# Patient Record
Sex: Female | Born: 1960 | Race: Black or African American | Hispanic: No | Marital: Married | Smoking: Never smoker
Health system: Southern US, Community
[De-identification: ages and names within clinical notes are randomized; demographics above are authoritative.]

## PROBLEM LIST (undated history)

## (undated) DIAGNOSIS — G5762 Lesion of plantar nerve, left lower limb: Secondary | ICD-10-CM

## (undated) DIAGNOSIS — I440 Atrioventricular block, first degree: Secondary | ICD-10-CM

## (undated) DIAGNOSIS — R55 Syncope and collapse: Secondary | ICD-10-CM

## (undated) DIAGNOSIS — I4510 Unspecified right bundle-branch block: Secondary | ICD-10-CM

## (undated) DIAGNOSIS — H9190 Unspecified hearing loss, unspecified ear: Secondary | ICD-10-CM

## (undated) DIAGNOSIS — I10 Essential (primary) hypertension: Secondary | ICD-10-CM

## (undated) HISTORY — PX: NEURECTOMY FOOT: SUR888

## (undated) HISTORY — DX: Essential (primary) hypertension: I10

---

## 1997-07-16 ENCOUNTER — Other Ambulatory Visit: Admission: RE | Admit: 1997-07-16 | Discharge: 1997-07-16 | Payer: Self-pay | Admitting: Obstetrics and Gynecology

## 1997-08-12 ENCOUNTER — Ambulatory Visit (HOSPITAL_COMMUNITY): Admission: RE | Admit: 1997-08-12 | Discharge: 1997-08-12 | Payer: Self-pay | Admitting: Obstetrics and Gynecology

## 1998-07-21 ENCOUNTER — Other Ambulatory Visit: Admission: RE | Admit: 1998-07-21 | Discharge: 1998-07-21 | Payer: Self-pay | Admitting: Obstetrics and Gynecology

## 1998-10-07 ENCOUNTER — Encounter (INDEPENDENT_AMBULATORY_CARE_PROVIDER_SITE_OTHER): Payer: Self-pay

## 1998-10-07 ENCOUNTER — Ambulatory Visit (HOSPITAL_COMMUNITY): Admission: RE | Admit: 1998-10-07 | Discharge: 1998-10-07 | Payer: Self-pay | Admitting: Obstetrics and Gynecology

## 1999-03-31 ENCOUNTER — Other Ambulatory Visit: Admission: RE | Admit: 1999-03-31 | Discharge: 1999-03-31 | Payer: Self-pay | Admitting: Obstetrics and Gynecology

## 1999-06-29 ENCOUNTER — Ambulatory Visit (HOSPITAL_COMMUNITY): Admission: RE | Admit: 1999-06-29 | Discharge: 1999-06-29 | Payer: Self-pay | Admitting: Gastroenterology

## 1999-06-29 ENCOUNTER — Encounter (INDEPENDENT_AMBULATORY_CARE_PROVIDER_SITE_OTHER): Payer: Self-pay | Admitting: *Deleted

## 1999-09-27 ENCOUNTER — Encounter: Admission: RE | Admit: 1999-09-27 | Discharge: 1999-09-27 | Payer: Self-pay | Admitting: Gastroenterology

## 1999-09-27 ENCOUNTER — Encounter: Payer: Self-pay | Admitting: Gastroenterology

## 1999-10-13 ENCOUNTER — Encounter: Payer: Self-pay | Admitting: Obstetrics and Gynecology

## 1999-10-14 DIAGNOSIS — K648 Other hemorrhoids: Secondary | ICD-10-CM | POA: Insufficient documentation

## 1999-10-19 ENCOUNTER — Inpatient Hospital Stay (HOSPITAL_COMMUNITY): Admission: RE | Admit: 1999-10-19 | Discharge: 1999-10-21 | Payer: Self-pay | Admitting: Obstetrics and Gynecology

## 1999-10-19 ENCOUNTER — Encounter (INDEPENDENT_AMBULATORY_CARE_PROVIDER_SITE_OTHER): Payer: Self-pay

## 2001-01-23 ENCOUNTER — Encounter: Payer: Self-pay | Admitting: Obstetrics and Gynecology

## 2001-01-23 ENCOUNTER — Ambulatory Visit (HOSPITAL_COMMUNITY): Admission: RE | Admit: 2001-01-23 | Discharge: 2001-01-23 | Payer: Self-pay | Admitting: Obstetrics and Gynecology

## 2002-01-28 ENCOUNTER — Encounter: Payer: Self-pay | Admitting: Family Medicine

## 2002-01-28 ENCOUNTER — Ambulatory Visit (HOSPITAL_COMMUNITY): Admission: RE | Admit: 2002-01-28 | Discharge: 2002-01-28 | Payer: Self-pay | Admitting: Family Medicine

## 2002-05-13 ENCOUNTER — Other Ambulatory Visit: Admission: RE | Admit: 2002-05-13 | Discharge: 2002-05-13 | Payer: Self-pay | Admitting: Obstetrics and Gynecology

## 2003-08-05 ENCOUNTER — Ambulatory Visit (HOSPITAL_COMMUNITY): Admission: RE | Admit: 2003-08-05 | Discharge: 2003-08-05 | Payer: Self-pay | Admitting: Family Medicine

## 2009-12-01 ENCOUNTER — Encounter: Admission: RE | Admit: 2009-12-01 | Discharge: 2009-12-01 | Payer: Self-pay | Admitting: Family Medicine

## 2010-03-26 ENCOUNTER — Other Ambulatory Visit: Payer: Self-pay | Admitting: Family Medicine

## 2010-03-26 ENCOUNTER — Ambulatory Visit
Admission: RE | Admit: 2010-03-26 | Discharge: 2010-03-26 | Disposition: A | Discharge status: Home / Self Care | Payer: Self-pay | Source: Ambulatory Visit | Attending: Family Medicine | Admitting: Family Medicine

## 2010-03-26 DIAGNOSIS — Z0000 Encounter for general adult medical examination without abnormal findings: Secondary | ICD-10-CM

## 2010-03-31 ENCOUNTER — Ambulatory Visit
Admission: RE | Admit: 2010-03-31 | Discharge: 2010-03-31 | Disposition: A | Discharge status: Home / Self Care | Payer: Self-pay | Source: Ambulatory Visit | Attending: Family Medicine | Admitting: Family Medicine

## 2010-03-31 ENCOUNTER — Other Ambulatory Visit: Payer: Self-pay | Admitting: Family Medicine

## 2010-03-31 DIAGNOSIS — J984 Other disorders of lung: Secondary | ICD-10-CM

## 2010-07-09 NOTE — Op Note (Signed)
[facility]  Patient:    Christine Salazar, Christine Salazar                 MRN: [id number] Proc. Date: 10/19/99 Adm. Date:  [id number] Attending:  Madelyn Flavors CC:         [facility]             Kern Reap, M.D.             Anselmo Rod, M.D.                           Operative Report  PREOPERATIVE DIAGNOSES:       1. Menorrhagia unresponsive to medical                                  management.                               2. Left hydrosalpinx.  POSTOPERATIVE DIAGNOSES:      1. Menorrhagia unresponsive to medical                                  management.                               2. Left ovarian cyst.                               3. Extensive adhesive disease.                               4. Endometriosis.  OPERATION:                    Total abdominal hysterectomy, left                               salpingo-oophorectomy, extensive lysis of                               adhesions.  SURGEON:                      Beather Arbour. Thomasena Edis, M.D.  ASSISTANT:                    Trevor Iha, M.D.  DRAINS:                       Foley catheter.  COMPLICATIONS:                None.  FLUIDS:                       Approximately 2000 cc of Crystalloid and                               cefazolin 1 g IV.  URINE OUTPUT:  300 cc.  FINDINGS:                     Left tube and ovary extensively adhesed to the                               left pelvic sidewall and deep into the pelvis,                               extensive pelvic adhesions.  ANESTHESIA:  DESCRIPTION OF PROCEDURE:     The patient was brought to the operating room and identified on the operating room table.  After induction of adequate general endotracheal anesthesia, the patient was placed in the supine position and prepped and draped in the usual sterile fashion.  A Foley catheter was placed.  A Pfannenstiel incision was made and carried down to  the fascia.  The fascia was scored in the midline and extended bilaterally using Mayo scissors. It was then separated free from the underlying muscles. The muscles were separated in midline down to the symphysis. Due to scarring, there was noted to be oozing but excellent hemostasis was achieved at each layer.  The peritoneum was entered sharply and carefully, taking care to avoid bowel or other abdominal contents. The peritoneum incision was extended superiorly then inferiorly down to the bladder edge.  There was noted to be extensive omental adhesions to the left anterior abdominal wall but these did not need to be lysed in order to facilitate closure. The uterus was noted to be extensively adhesed, deepened into the pelvis. There was noted to be extensive adhesion of the left tube and ovary.  After placement of the OConnor-OSullivan retractor and packing the bowel out of the operative field, Kelly clamps were placed either lateral border of the uterus and the uterus was elevated up to the level of the skin incision.  Prior to this time, the uterus was mobilized with extensive lysis of adhesions. The right round ligament was ligated with a suture of 0 Vicryl, divided, and the anterior leaf of the broad ligament was separated to the area overlying the internal os. The vessels were skeletonized.  The uterine vessels on the right were clamped with a curved Heaney, cut and suture ligated using a suture of 0 Vicryl.  In a similar fashion, the left round ligament was ligated with a suture of 0 Vicryl, divided, and the anterior leaf of the broad ligament was divided to the area overlying the internal os.  The utero-ovarian ligament was clamped after clear space was developed, cut and tied with a free tie.  The decision was made, due to extensive adhesions, to go back after the hysterectomy and remove the left tube and ovary.  The vessels on the left were then skeletonized and a curved Heaney was  used to clamp the uterine vessels which were then cut, suture ligated using a suture of 0 Vicryl.  Successive cardinal uterosacral ligament complex bites were taken with straight Heaneys, cut and suture ligated using sutures of 0 Vicryl.  This was done after the bladder was taken down over the cervix.  The uterosacral ligament complex on the right was clamped with a curved Heaney, cut and suture ligated using figure-of-eight sutures of 0 Vicryl.  This was accomplished on the left in a similar fashion.  After noting that the bladder was well  down and the uterus was free of adhesions, a curved Heaney was placed across the distal portion of the cervix and the vagina was entered.  This angle was then sutured using a figure-of-eight suture of 0 Vicryl.  In a similar fashion, the vagina was entered on the left. Figure-of-eight sutures of 0 Vicryl were placed then to close the vaginal cuff.  There was noted to be no bleeding at the vaginal cuff.  Attention was then turned to the left tube and ovary. These were very densely adhesed such that landmarks could not easily be identified.  However, the cyst had ruptured and we were able to palpate the cyst well and palpate it internally.  The broad ligament posteriorly was divided and a retroperitoneal dissection preceded on the left. The ureter was identified.  Using a mixture of both sharp and blunt dissection, the ovary and tube were dissected from the left pelvic sidewall and then from deep in the pelvis.  The left infundibulopelvic ligament was identified, clamped with a Heaney clamp and doubly ligated using free ties of 0 Vicryl. There was noted to be excellent hemostasis at this pedicle.  There was also noted to be a cystic area between what appeared to be two loops of bowel and this small adhesion was very carefully lysed taking care not to enter the bowel.  This was found to be a peritoneal inclusion cyst.  Continuing with both sharp and blunt  dissection, taking care to stay  away from any bowel loops and also with the ureter having been identified, the left tube and ovary were mobilized and removed and sent to pathology for frozen section which returned as benign findings.  The pelvis was copiously irrigated with warm lactated Ringers and excellent hemostasis was achieved using cautery. There were just two oozing peritoneal edges that required cautery.  All pedicles were noted to be extremely hemostatic.  Again, the bowel and pelvic sidewall all were noted to have suffered no organ damage.  At that point, the packs packing the bowel out of the way were removed from the operative field and peritoneal surfaces were noted to be smooth.  The OConnor-OSullivan and abdominal wall retractors as well as bladder blade retractor were removed.  The patient was taken out of Trendelenburg position and the muscles were closed using interrupted sutures of 2-0 Vicryl.  The muscles were noted to be very hemostatic. The fascia was closed using two sutures of 0 PDS with each suture anchored at either end of the incision, run to the midline and tied.  The skin was closed with staples and Steri-Strips were applied.  The patient tolerated the procedure well without apparent complications and was transferred to the recovery room in stable condition after all sponge, needle and instrument counts were correct. DD:  10/19/99 TD:  10/19/99 Job: [id number]

## 2010-07-09 NOTE — H&P (Signed)
[facility]  Patient:    Christine Salazar, Christine Salazar                      MRN: [id number] Adm. Date:  [id number] Disc. Date: [id number] Attending:  Charna Elizabeth CC:         [facility]   History and Physical  HISTORY OF PRESENT ILLNESS:  The patient is a 50 year old, para 0, who is admitted for TAH/BSO.  She has been followed for a long period of time due to menorrhagia.  Her hemoglobin in the office in mid July was 13 but the patient takes iron.  She has had a complete GI work-up including upper endoscopy, as well, could be due to her anemia.  She has been tried on oral contraceptives but has been unable to take these due to her hypertension.  Micronor has resulted in no change in menorrhagia.  The menorrhagia occurs such that she soaks her clothes and passes large clots.  She has had an extensive work-up including D&C hysteroscopy in August of last year, which showed benign secretory endometrium.  A recent ultrasound did show adenomyosis.  The patient does have some small fibroids.  The patient is thus admitted for a TAH/BSO due to this continued menorrhagia.  The patient does desire removal of her ovaries.  Risks of surgery including anesthetic complication, hemorrhage, infection, damage to adjacent structures, including bladder, bowel, blood vessels, ureters were discussed with the patient.  She was made aware of the risks of unforeseen risk and also postoperative risks which could include vesicovaginaL fistula, wound infection, urinary tract infection, DVT, which could result in stroke or pulmonary embolism.  She expresses understanding of and acceptance of these risks.  PAST MEDICAL HISTORY:  Significant for exploratory laparotomy and right salpingo-oophorectomy in 1997 due to an enlarging right ovarian cyst.  Also history of systole in ano.  History of hypertension and known history of a left total salpinx.  ALLERGIES:   No known drug allergies.  MEDICATIONS: 1. Triamterene one p.o. b.i.d. 2. Iron sulfate.  SOCIAL HISTORY:  The patient is a Estate manager/land agent at [employer].  FAMILY HISTORY:  Noncontributory.  REVIEW OF SYSTEMS:  Negative.  PHYSICAL EXAMINATION:  HEENT:   Normal.  NECK:  Supple without thyromegaly.  LUNGS:  Clear to auscultation.  CARDIAC:  Regular rate and rhythm.  ABDOMEN:  Soft, nontender, no hepatosplenomegaly, well-healed exploratory laparotomy scar.  PELVIC:  BUS normal.  Pap smear done February 2001 was within normal limits. Uterus is normal in size, shape and contour, enlarged approximately 11-12 weeks.  No adnexal masses palpated.  Rectal is confirmatory.  ASSESSMENT AND PLAN:  The patient is a 50 year old, gravida 0, who is admitted for total abdominal hysterectomy, left salpingo-oophorectomy and right oophorectomy if there is any residual ovarian tissue.  Risks have been explained to the patient and she expresses understanding of and acceptance of these risks.  She has failed medical management and is thought to have probable adenomyosis. DD:  10/19/99 TD:  10/19/99 Job: [id number]

## 2010-07-09 NOTE — Procedures (Signed)
[place]. [facility]  Patient:    Christine Salazar, Christine Salazar                     MRN: [id number] Proc. Date: 06/29/99 Attending:  Anselmo Rod, M.D. CC:         Dr. Eunice Blase                           Procedure Report  DATE OF BIRTH:  [birth year]  REFERRING PHYSICIAN:  Dr. Eunice Blase.  PROCEDURE PERFORMED:  Esophagogastroduodenoscopy with biopsies.  ENDOSCOPIST:  Anselmo Rod, M.D.  INSTRUMENT USED:  Olympus video panendoscope.  INDICATIONS FOR PROCEDURE:  A 50 year old black female with a history of iron-deficiency anemia, rule out peptic ulcer disease, esophagitis, gastritis, etc.  PREPROCEDURE PREPARATION:  Informed consent was procured from the patient. The patient was fasted for eight hours prior to the procedure.  PREPROCEDURE PHYSICAL:  VITAL SIGNS:  Stable.  NECK:  Supple.  CHEST:  Clear to auscultation, S1 and S2 regular.  ABDOMEN:  Soft with normal abdominal bowel sounds.  DESCRIPTION OF PROCEDURE:  The patient was placed in the left lateral decubitus position and sedated with 100 mg of Demerol and 10 mg of Versed intravenously.  Once the patient was adequately sedated, maintained on low flow oxygen, and continuous cardiac monitoring, the Olympus video panendoscope was advanced through the mouth piece over the tongue into the esophagus under direct vision.  The entire esophagus appeared normal without evidence of ring stricture, masses, lesions, or esophagitis.  A 4-5 cm hiatal hernia was seen on retroflexion in the high cardia.  There were several fundic polyps that were biopsied for pathology.  It is very small in size.  There was some residual debris in the stomach that was suctioned clear.  Visualization of the gastric mucosa, the duodenal bulb, and the small bowel distal to the bulb up to 60 cm appeared normal.  There was no outlet obstruction.  The patient tolerated the procedure well without  complication.  IMPRESSION: 1. Several small fundic polyps, biopsied for pathology. 2. Four to five centimeter hiatal hernia seen on retroflexion. 3. Normal-appearing mid body and antrum, some residual debris in the stomach. 4. Normal-appearing duodenal bulb and proximal small bowel.  RECOMMENDATIONS: 1. Proceed with colonoscopy at this time, await pathology results. 2. Outpatient follow up in the next 10 days. DD:  06/29/99 TD:  07/01/99 Job: [id number]

## 2010-07-09 NOTE — Op Note (Signed)
[place]. [facility]  Patient:    Christine Salazar, Christine Salazar                     MRN: [id number] Proc. Date: 06/29/99 Attending:  Anselmo Rod, M.D. CC:         Kern Reap, M.D.                           Operative Report  DATE OF BIRTH:  [birth year]  PROCEDURE PERFORMED:  Colonoscopy  ENDOSCOPIST:  Anselmo Rod, M.D.  INSTRUMENT USED:  Olympus video colonoscope.  INDICATION FOR PROCEDURE:  A 50 year old black female with iron deficiency anemia, rule out masses, polyps, hemorrhoids, diverticulosis, etc.  PREPROCEDURE PREPARATION:  Informed consent was procured from the patient. The patient was fasted for eight hours prior to the procedure and prepped with a bottle of magnesium citrate and a gallon of NuLytely the night prior to the procedure.  PREPROCEDURE PHYSICAL:  The patient had stable vital signs.  NECK: Supple.  CHEST:  Clear to auscultation.  S1 and S2 regular.  ABDOMEN: Soft with normal abdominal bowel sounds.  DESCRIPTION OF PROCEDURE:  The patient was placed in the left lateral decubitus position. No additional sedation was given for the colonoscopy. Once the patient was adequately sedated, the Olympus video colonoscope was advanced from the rectum to the cecum without difficulty.  The patient had a fairly good prep.  A small diverticulum was seen in the mid transverse colon. The patient had small internal hemorrhoids and no masses or polyps were present. The patient tolerated the procedure well without complication.  The procedure was complete up to cecal, ileocecal valve and appendiceal orifices were clearly visualized.  IMPRESSION: 1. Small nonbleeding internal hemorrhoids. 2. 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  RECOMMENDATIONS:  The patient was advised to follow up in the office.  She may require small bowel follow through to complete her GI evaluation. DD:   06/29/99 TD:  07/01/99 Job: [id number]

## 2010-07-09 NOTE — Discharge Summary (Signed)
[facility]  Patient:    Christine Salazar, Christine Salazar                 MRN: [id number] Adm. Date:  [id number] Disc. Date: [id number] Attending:  Madelyn Flavors CC:         [facility] R. Renae Gloss, M.D.   Discharge Summary  HISTORY OF PRESENT ILLNESS:   The patient is a 50 year old para 0 who was admitted for a TAH/LSO.  She had been followed for a long period of time due to menorrhagia.  For further details, please see the dictated history and physical.  However, the menorrhagia was totally unresponsive to medical management.  She was able to tolerate oral contraceptives for the bleeding but began to have uncontrolled hypertension and this medication had to be discontinued.  PAST MEDICAL HISTORY:         Significant for exploratory laparotomy and right salpingo-oophorectomy in 1997 due to an enlarging right ovarian cyst.  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  HOSPITAL COURSE:              The patient was admitted and underwent a total abdominal hysterectomy and left salpingo-oophorectomy without difficulty.  She also was found to have extensive pelvic adhesions and underwent lysis of adhesions as well.  Postoperatively the patient had a stable postoperative course.  On postoperative day #1, lungs were clear to auscultation, abdomen was soft and nontender and she had positive bowel sounds.  A postoperative hemoglobin was 10.7 with a normal white count of 9400.  On postoperative day #2, the patient was ambulating well, tolerating her diet and had return of bowel function with positive flatus, positive bowel movement.  Her incision was clean and dry.  The patient was subsequently discharged home.  DISCHARGE MEDICATIONS:        She was started on Estrace 2 mg daily as hormone replacement therapy and given a prescription for Tylox one to two p.o. q.3-4h. as needed for pain.  In  addition, she was urged to take ibuprofen 600 mg every six hours as needed for pain.  FOLLOW-UP:                    She is to return to the office in one week for staple removal but call for temperature greater than 100.5 persistently.  She was also urged to do no heavy lifting.  PATHOLOGY:                    Pathology returned as proliferative endometrium, extensive adenomyosis, leiomyomata uteri and uterine serosal fibrous adhesions and endometriosis.  The left tube and ovary showed tubal endometriosis associated with chronic salpingitis. DD:  11/30/99 TD:  12/01/99 Job: [id number]

## 2010-09-17 ENCOUNTER — Inpatient Hospital Stay (INDEPENDENT_AMBULATORY_CARE_PROVIDER_SITE_OTHER)
Admission: RE | Admit: 2010-09-17 | Discharge: 2010-09-17 | Disposition: A | Discharge status: Home / Self Care | Payer: PRIVATE HEALTH INSURANCE | Source: Ambulatory Visit | Attending: Emergency Medicine | Admitting: Emergency Medicine

## 2010-09-17 DIAGNOSIS — L03019 Cellulitis of unspecified finger: Secondary | ICD-10-CM

## 2010-09-20 LAB — CULTURE, ROUTINE-ABSCESS

## 2013-06-12 ENCOUNTER — Ambulatory Visit
Admission: RE | Admit: 2013-06-12 | Discharge: 2013-06-12 | Disposition: A | Discharge status: Home / Self Care | Payer: BC Managed Care – PPO | Source: Ambulatory Visit | Attending: Family Medicine | Admitting: Family Medicine

## 2013-06-12 ENCOUNTER — Other Ambulatory Visit: Payer: Self-pay | Admitting: Family Medicine

## 2013-06-12 ENCOUNTER — Other Ambulatory Visit (HOSPITAL_COMMUNITY)
Admission: RE | Admit: 2013-06-12 | Discharge: 2013-06-12 | Disposition: A | Discharge status: Home / Self Care | Payer: BC Managed Care – PPO | Source: Ambulatory Visit | Attending: Family Medicine | Admitting: Family Medicine

## 2013-06-12 DIAGNOSIS — Z01419 Encounter for gynecological examination (general) (routine) without abnormal findings: Secondary | ICD-10-CM | POA: Insufficient documentation

## 2013-06-12 DIAGNOSIS — R52 Pain, unspecified: Secondary | ICD-10-CM

## 2013-06-12 DIAGNOSIS — Z1151 Encounter for screening for human papillomavirus (HPV): Secondary | ICD-10-CM | POA: Insufficient documentation

## 2013-07-05 ENCOUNTER — Encounter: Payer: Self-pay | Admitting: Podiatrist

## 2013-07-05 ENCOUNTER — Ambulatory Visit (INDEPENDENT_AMBULATORY_CARE_PROVIDER_SITE_OTHER): Payer: BC Managed Care – PPO

## 2013-07-05 ENCOUNTER — Ambulatory Visit (INDEPENDENT_AMBULATORY_CARE_PROVIDER_SITE_OTHER): Payer: BC Managed Care – PPO | Admitting: Podiatrist

## 2013-07-05 VITALS — BP 143/93 | HR 82 | Resp 16 | Ht 69.0 in | Wt 212.0 lb

## 2013-07-05 DIAGNOSIS — G5760 Lesion of plantar nerve, unspecified lower limb: Secondary | ICD-10-CM

## 2013-07-05 DIAGNOSIS — D219 Benign neoplasm of connective and other soft tissue, unspecified: Secondary | ICD-10-CM

## 2013-07-05 DIAGNOSIS — D3610 Benign neoplasm of peripheral nerves and autonomic nervous system, unspecified: Secondary | ICD-10-CM

## 2013-07-05 MED ORDER — TRIAMCINOLONE ACETONIDE 10 MG/ML IJ SUSP
10.0000 mg | Freq: Once | INTRAMUSCULAR | Status: AC
  Administered 2013-07-05: 10 mg

## 2013-07-05 NOTE — Patient Instructions (Signed)
Morton's Neuroma Neuralgia (nerve pain) or neuroma (benign [non-cancerous] nerve tumor) may develop on any interdigital nerve. The interdigital nerves (nerves between digits) of the foot travel beneath and between the metatarsals (long bones of the fore foot) and pass the nerve endings to the toes. The third interdigital is a common place for a small neuroma to form called Morton's neuroma. Another nerve to be affected commonly is the fourth interdigital nerve. This would be in approximately in the area of the base or ball under the bottom of your fourth toe. This condition occurs more commonly in women and is usually on one side. It is usually first noticed by pain radiating (spreading) to the ball of the foot or to the toes. CAUSES The cause of interdigital neuralgia may be from low grade repetitive trauma (damage caused by an accident) as in activities causing a repeated pounding of the foot (running, jumping etc.). It is also caused by improper footwear or recent loss of the fatty padding on the bottom of the foot. TREATMENT  The condition often resolves (goes away) simply with decreasing activity if that is thought to be the cause. Proper shoes are beneficial. Orthotics (special foot support aids) such as a metatarsal bar are often beneficial. This condition usually responds to conservative therapy, however if surgery is necessary it usually brings complete relief. HOME CARE INSTRUCTIONS   Apply ice to the area of soreness for 15-20 minutes, 03-04 times per day, while awake for the first 2 days. Put ice in a plastic bag and place a towel between the bag of ice and your skin.  Only take over-the-counter or prescription medicines for pain, discomfort, or fever as directed by your caregiver. MAKE SURE YOU:   Understand these instructions.  Will watch your condition.  Will get help right away if you are not doing well or get worse. Document Released: 05/16/2000 Document Revised: 05/02/2011  Document Reviewed: 02/07/2005 [employer] Patient Information 2014 [employer].  

## 2013-07-05 NOTE — Progress Notes (Signed)
   Subjective:    Patient ID: Christine Salazar, female    DOB: 1960/02/26, 53 y.o.   MRN: [id number]  HPI Comments: Been having a burning pain in between my toes. Its a neuroma , i had it removed once they told me it would come back again. It has been going on for years  Foot Pain      Review of Systems  All other systems reviewed and are negative.      Objective:   Physical Exam Patient is awake, alert, and oriented x 3.  In no acute distress.  Vascular status is intact with palpable pedal pulses at 2/4 DP and PT bilateral and capillary refill time within normal limits. Neurological sensation is also intact bilaterally via Semmes Weinstein monofilament at 5/5 sites. Light touch, vibratory sensation, Achilles tendon reflex is intact. Dermatological exam reveals skin color, turger and texture as normal. No open lesions present.  Musculature intact with dorsiflexion, plantarflexion, inversion, eversion. Splaying between digits 2 and 3 of the left foot is noted. Hammertoe contracture is also present. Numbness and pain is present with palpation between the second and third interspaces of the left foot. Mild palpable click is present.    Assessment & Plan:  Stump neuroma left foot  Plan: Discussed etiology and pathology of a sterile neuroma. At today's visit I performed a diagnostic and therapeutic injection in the second interspace of the left foot with Kenalog and Marcaine plain. I will see her back in 2 weeks. At that time we will discuss options regarding the neuroma including surgical excision versus alcohol sclerosing injections.

## 2013-09-13 ENCOUNTER — Ambulatory Visit (INDEPENDENT_AMBULATORY_CARE_PROVIDER_SITE_OTHER): Payer: BC Managed Care – PPO | Admitting: Podiatrist

## 2013-09-13 ENCOUNTER — Encounter: Payer: Self-pay | Admitting: Podiatrist

## 2013-09-13 ENCOUNTER — Ambulatory Visit: Payer: BC Managed Care – PPO | Admitting: Podiatrist

## 2013-09-13 VITALS — BP 132/78 | HR 75 | Resp 17

## 2013-09-13 DIAGNOSIS — D219 Benign neoplasm of connective and other soft tissue, unspecified: Secondary | ICD-10-CM

## 2013-09-13 DIAGNOSIS — D3610 Benign neoplasm of peripheral nerves and autonomic nervous system, unspecified: Secondary | ICD-10-CM

## 2013-09-13 NOTE — Progress Notes (Signed)
   Subjective:    Patient ID: Christine Salazar, female    DOB: 20-Oct-1960, 53 y.o.   MRN: [id number]  HPI  Pt presents as f/u neuroma on left foo-- she states the shot offered minimal relief. States she has new pain in right foot, burning sensation on plantar region that has lasted for 2 days, no treatments have been done at this time  Review of Systems     Objective:   Physical Exam  Neurovascular status unchanged.  Generalized pain present 2nd interspace of the left foot. Prior neuroma resection has been performed in the past. Concern for a stump neuroma is present.  Pain is also elicited 2nd and 3rd interspace of the right foot.       Assessment & Plan:  Assessment:  Stump neuroma left foot.    Plan:  We are ordering an MRI on the left foot-- will discuss surgery if the mri shows a stump neuroma.  We will get the mri ordered and will follow up accordingly.

## 2013-09-13 NOTE — Patient Instructions (Addendum)
We will order an MRI for your foot-- I will call and let you know options for helping your foot.it likely will need excision of the neuroma  Morton's Neuroma in Sports  (Interdigital Plantar Neuroma) Morton's neuroma is a condition of the nervous system that results in pain or loss of feeling in the toes. The disease is caused by the bones of the foot squeezing the nerve that runs between two toes (interdigital nerve). The third and fourth toes are most likely to be affected by this disease. SYMPTOMS   Tingling, numbness, burning, or electric shocks in the front of the foot, often involving the third and fourth toes, although it may involve any other pair of toes.  Pain and tenderness in the front of the foot, that gets worse when walking.  Pain that gets worse when pressure is applied to the foot (wearing shoes).  Severe pain in the front of the foot, when standing on the front of the foot (on tiptoes), such as with running, jumping, pivoting, or dancing. CAUSES  Morton's neuroma is caused by swelling of the nerve between two toes. This swelling causes the nerve to be pinched between the bones of the foot. RISK INCREASES WITH:  Recurring foot or ankle injuries.  Poor fitting or worn shoes, with minimal padding and shock absorbers.  Loose ligaments of the foot, causing thickening of the nerve.  Poor foot strength and flexibility. PREVENTION  Warm up and stretch properly before activity.  Maintain physical fitness:  Foot and ankle flexibility.  Muscle strength and endurance.  Cardiovascular fitness.  Wear properly fitted and padded shoes.  Wear arch supports (orthotics), when needed. PROGNOSIS  If treated properly, Morton's neuroma can usually be cured with non-surgical treatment. For certain cases, surgery may be needed. RELATED COMPLICATIONS  Permanent numbness and pain in the foot.  Inability to participate in athletics, because of pain. TREATMENT Treatment first  involves stopping any activities that make the symptoms worse. The use of ice and medicine will help reduce pain and inflammation. Wearing shoes with a wide toe box, and an orthotic arch support or metatarsal bar, may also reduce pain. Your caregiver may give you a corticosteroid injection, to further reduce inflammation. If non-surgical treatment is unsuccessful, surgery may be needed. Surgery to fix Morton's neuroma is often performed as an outpatient procedure, meaning you can go home the same day as the surgery. The procedure involves removing the source of pressure on the nerve. If it is necessary to remove the nerve, you can expect persistent numbness. MEDICATION  If pain medicine is needed, nonsteroidal anti-inflammatory medicines (aspirin and ibuprofen), or other minor pain relievers (acetaminophen), are often advised.  Do not take pain medicine for 7 days before surgery.  Prescription pain relievers are usually prescribed only after surgery. Use only as directed and only as much as you need.  Corticosteroid injections are used in extreme cases, to reduce inflammation. These injections should be done only if necessary, because they may be given only a limited number of times. HEAT AND COLD  Cold treatment (icing) should be applied for 10 to 15 minutes every 2 to 3 hours for inflammation and pain, and immediately after activity that aggravates your symptoms. Use ice packs or an ice massage.  Heat treatment may be used before performing stretching and strengthening activities prescribed by your caregiver, physical therapist, or athletic trainer. Use a heat pack or a warm water soak. SEEK MEDICAL CARE IF:   Symptoms get worse or do not  improve in 2 weeks, despite treatment.  After surgery you develop increasing pain, swelling, redness, increased warmth, bleeding, drainage of fluids, or fever.  New, unexplained symptoms develop. (Drugs used in treatment may produce side effects.) Document  Released: 12/15/2004 Document Revised: 05/02/2011 Document Reviewed: 05/22/2008 [facility] Patient Information 2015 [place], [place]. This information is not intended to replace advice given to you by your health care provider. Make sure you discuss any questions you have with your health care provider.

## 2013-09-16 ENCOUNTER — Telehealth: Payer: Self-pay | Admitting: *Deleted

## 2013-09-16 DIAGNOSIS — D3610 Benign neoplasm of peripheral nerves and autonomic nervous system, unspecified: Secondary | ICD-10-CM

## 2013-09-16 NOTE — Telephone Encounter (Signed)
Message copied by Lolita Rieger on Mon Sep 16, 2013  2:10 PM ------      Message from: Bronson Ing      Created: Sun Sep 15, 2013 11:09 PM      Regarding: mri left foot       This is the patient that needs an mri ordered for her left foot-- with contract-- forefoot.  Looking for a stump neuroma.  Thanks!       ------

## 2013-09-16 NOTE — Telephone Encounter (Signed)
Per Dr. Valentina Lucks, I put in an order for a MRI of the left foot with and without contrast at [facility] on [address].

## 2013-10-09 ENCOUNTER — Telehealth: Payer: Self-pay | Admitting: *Deleted

## 2013-10-09 NOTE — Telephone Encounter (Signed)
Need MRI authorization from [facility] for service on tomorrow at 10am.  The procedure code is 73723, with and without contrast.  I got the authorization valid from 10/09/2013- 11/07/2013.  I faxed the authorization to [facility].

## 2013-10-10 ENCOUNTER — Ambulatory Visit
Admission: RE | Admit: 2013-10-10 | Discharge: 2013-10-10 | Disposition: A | Discharge status: Home / Self Care | Payer: BC Managed Care – PPO | Source: Ambulatory Visit | Attending: Podiatrist | Admitting: Podiatrist

## 2013-10-10 DIAGNOSIS — D3610 Benign neoplasm of peripheral nerves and autonomic nervous system, unspecified: Secondary | ICD-10-CM

## 2013-10-10 MED ORDER — GADOBENATE DIMEGLUMINE 529 MG/ML IV SOLN
20.0000 mL | Freq: Once | INTRAVENOUS | Status: AC | PRN
  Administered 2013-10-10: 20 mL via INTRAVENOUS

## 2013-10-16 ENCOUNTER — Telehealth: Payer: Self-pay | Admitting: *Deleted

## 2013-10-16 NOTE — Telephone Encounter (Signed)
I called to inform the patient's mother that Dr. Milinda Pointer received the MRI results but wants to have them re-read.  We are sending them to [facility].  We will call you when we get the results to schedule her an appointment for follow-up.  She stated okay thank you.  Disk was sent to [facility].  [place], [place] [address]

## 2013-10-17 ENCOUNTER — Telehealth: Payer: Self-pay | Admitting: *Deleted

## 2013-10-17 NOTE — Telephone Encounter (Signed)
I called and informed [place] that I called her in error yesterday.  The MRI did show a stump Neuroma.  I told her that it can be removed if she would like to proceed.  Her mother said she will tell her and call us back to schedule the appointment.  She asked where the procedure will be performed.  I told her it will be discussed when she comes in for the consultation.  I called and informed [facility] that the disk had been sent in error to please return it.  She said they have not received it but will return it once they get it.

## 2013-10-17 NOTE — Telephone Encounter (Signed)
Message copied by Lolita Rieger on Thu Oct 17, 2013 12:27 PM ------      Message from: Bronson Ing      Created: Thu Oct 10, 2013  1:21 PM      Regarding: mri       Please call and let Christine Salazar know that the mri does show she has a small stump neuroma present in the place that is giving her pain.  Make her an appt to see me if she would like to discuss removing it surgically-- otherwise, injections aren't typically helpful for these types of stump neuromas.  Thanks!      ----- Message -----         From: Rad Results In Interface         Sent: 10/10/2013  12:09 PM           To: Bronson Ing, DPM                   ------

## 2013-10-25 ENCOUNTER — Ambulatory Visit (INDEPENDENT_AMBULATORY_CARE_PROVIDER_SITE_OTHER): Payer: BC Managed Care – PPO | Admitting: Podiatrist

## 2013-10-25 ENCOUNTER — Encounter: Payer: Self-pay | Admitting: Podiatrist

## 2013-10-25 VITALS — BP 134/86 | HR 90 | Resp 12

## 2013-10-25 DIAGNOSIS — D219 Benign neoplasm of connective and other soft tissue, unspecified: Secondary | ICD-10-CM

## 2013-10-25 DIAGNOSIS — D3610 Benign neoplasm of peripheral nerves and autonomic nervous system, unspecified: Secondary | ICD-10-CM

## 2013-10-25 NOTE — Patient Instructions (Signed)
Pre-Operative Instructions  Congratulations, you have decided to take an important step to improving your quality of life.  You can be assured that the doctors of [facility] will be with you every step of the way.  1. Plan to be at the surgery center/hospital at least 1 (one) hour prior to your scheduled time unless otherwise directed by the surgical center/hospital staff.  You must have a responsible adult accompany you, remain during the surgery and drive you home.  Make sure you have directions to the surgical center/hospital and know how to get there on time. 2. For hospital based surgery you will need to obtain a history and physical form from your family physician within 1 month prior to the date of surgery- we will give you a form for you primary physician.  3. We make every effort to accommodate the date you request for surgery.  There are however, times where surgery dates or times have to be moved.  We will contact you as soon as possible if a change in schedule is required.   4. No Aspirin/Ibuprofen for one week before surgery.  If you are on aspirin, any non-steroidal anti-inflammatory medications (Mobic, Aleve, Ibuprofen) you should stop taking it 7 days prior to your surgery.  You make take Tylenol  For pain prior to surgery.  5. Medications- If you are taking daily heart and blood pressure medications, seizure, reflux, allergy, asthma, anxiety, pain or diabetes medications, make sure the surgery center/hospital is aware before the day of surgery so they may notify you which medications to take or avoid the day of surgery. 6. No food or drink after midnight the night before surgery unless directed otherwise by surgical center/hospital staff. 7. No alcoholic beverages 24 hours prior to surgery.  No smoking 24 hours prior to or 24 hours after surgery. 8. Wear loose pants or shorts- loose enough to fit over bandages, boots, and casts. 9. No slip on shoes, sneakers are best. 10. Bring  your boot with you to the surgery center/hospital.  Also bring crutches or a walker if your physician has prescribed it for you.  If you do not have this equipment, it will be provided for you after surgery. 11. If you have not been contracted by the surgery center/hospital by the day before your surgery, call to confirm the date and time of your surgery. 12. Leave-time from work may vary depending on the type of surgery you have.  Appropriate arrangements should be made prior to surgery with your employer. 13. Prescriptions will be provided immediately following surgery by your doctor.  Have these filled as soon as possible after surgery and take the medication as directed. 14. Remove nail polish on the operative foot. 15. Wash the night before surgery.  The night before surgery wash the foot and leg well with the antibacterial soap provided and water paying special attention to beneath the toenails and in between the toes.  Rinse thoroughly with water and dry well with a towel.  Perform this wash unless told not to do so by your physician.  Enclosed: 1 Ice pack (please put in freezer the night before surgery)   1 Hibiclens skin cleaner   Pre-op Instructions  If you have any questions regarding the instructions, do not hesitate to call our office.  [place]: [address] [place], [place] [address] [phone]  [place]: [address] [place], [place] [address] [phone]  [place]: [address]  [place], [place] [address] [phone]  Dr. Richard   Tuchman DPM, Dr. Norman Regal DPM Dr. Richard Sikora DPM, Dr. M. Todd Hyatt DPM, Dr. Micaela Stith DPM 

## 2013-10-25 NOTE — Progress Notes (Signed)
Subjective: Christine Salazar presents today for followup of her left foot pain second interspace between the second and third metatarsals where previous neuroma resection was performed. The MRI showed a probable stump neuroma. We have tried all conservative treatments which have all failed. At this time she is pointing to her surgical options.  Objective: Neurovascular status intact to bilateral feet with palpable symmetric pedal pulses and neurological sensation intact. Scar is present between the second and third metatarsal heads dorsally. Pain on palpation second metatarsal interspace is noted. Pain with direct plantar pressure is also present consistent with stump neuroma. MRI did show findings consistent with a stump neuroma.  Assessment: Stump neuroma left foot second interspace  Plan: Discussed conservative versus surgical options. Recommended a plantar excision of stump neuroma left foot-second interspace. The consent form was discussed and all three pages were signed and the patient's questions were encouraged and answered to the best of my ability. Risks of the surgery were discussed including but not limited to continued pain, infection, swelling, suture or implant reaction, bleeding, decreased function, etc. Preoperative instructions were also dispensed to the patient as well as a preoperative surgical pamphlet to go along with the instructions. Surgery will be scheduled at the patients convenience and patient will be seen at [facility] specialty surgery center on outpatient basis.The patient is instructed to call if any questions or concerns arise.

## 2013-11-13 DIAGNOSIS — G5760 Lesion of plantar nerve, unspecified lower limb: Secondary | ICD-10-CM

## 2013-11-15 ENCOUNTER — Telehealth: Payer: Self-pay

## 2013-11-15 NOTE — Telephone Encounter (Signed)
Spoke with patient regarding post  Operative status. She states that she is doing well and managing pain effectively. Advised to elevate and keep dressing dry and intact until follow up appointment

## 2013-11-20 ENCOUNTER — Encounter: Payer: Self-pay | Admitting: Podiatrist

## 2013-11-20 ENCOUNTER — Ambulatory Visit (INDEPENDENT_AMBULATORY_CARE_PROVIDER_SITE_OTHER): Payer: BC Managed Care – PPO | Admitting: Podiatrist

## 2013-11-20 VITALS — BP 124/72 | HR 85 | Resp 16

## 2013-11-20 DIAGNOSIS — D3610 Benign neoplasm of peripheral nerves and autonomic nervous system, unspecified: Secondary | ICD-10-CM

## 2013-11-20 DIAGNOSIS — Z9889 Other specified postprocedural states: Secondary | ICD-10-CM

## 2013-11-20 DIAGNOSIS — D219 Benign neoplasm of connective and other soft tissue, unspecified: Secondary | ICD-10-CM

## 2013-11-20 MED ORDER — OXYCODONE-ACETAMINOPHEN 7.5-325 MG PO TABS: 2.0000 | ORAL_TABLET | ORAL | Status: DC | PRN

## 2013-11-20 NOTE — Progress Notes (Signed)
Subjective: Patient presents today1 week status post foot surgery of the left foot.  Date of surgery 11-13-13 consisting of a neuroma excision plantar left foot 2nd interspace. Patient denies nausea, vomiting, fevers, chills or night sweats.  Denies calf pain or tenderness to the operative side. Relates pain in the operative foot consistent with 1 week post op  Objective:  Neurovascular status is intact with palpable pedal pulses DP and PT bilateral at 2+ out of 4. Neurological sensation is intact and unchanged as per prior to surgery. Excellent appearance of the postoperative foot is noted. Incision site is well coapted with sutures in place. No redness, swelling or sign of infection is present.  Small amount of zinging pain is present dorsum left foot at the 3rd metatarsal head.   Assessment: Status post left foot plantar neuroma excision  Plan:  Redressed the foot and a dry sterile compressive dressing. She will be seen back in one week and every other suture will be removed. I will then see her back in one following week and we will remove the remainder of the sutures. At one week she should be able to get the foot wet. She will continue wearing the wedge shoe until the incision site is completely healed which should be 3 weeks postoperative. I refilled her pain medication and gave her instructions for its use. If any concerns arise she will call.

## 2013-11-25 ENCOUNTER — Telehealth: Payer: Self-pay | Admitting: *Deleted

## 2013-11-25 NOTE — Telephone Encounter (Signed)
I'm calling just to let Dr. Valentina Lucks know that I'm still having a lot of pain around my foot and around my toes, okay.  Thank you, [name]  I called and informed her it is normal to still hurt. I asked her if she still has pain medication.  She said yes.  I advised her to continue to take the pain medication at night.  She stated, "It hurts the worse at night."  I told her that is nothing to worry about, it's normal.  She stated okay.

## 2013-11-29 ENCOUNTER — Encounter: Payer: Self-pay | Admitting: Podiatrist

## 2013-11-29 ENCOUNTER — Ambulatory Visit (INDEPENDENT_AMBULATORY_CARE_PROVIDER_SITE_OTHER): Payer: BC Managed Care – PPO | Admitting: Podiatrist

## 2013-11-29 ENCOUNTER — Telehealth: Payer: Self-pay | Admitting: *Deleted

## 2013-11-29 VITALS — BP 129/81 | HR 78 | Resp 16

## 2013-11-29 DIAGNOSIS — Z9889 Other specified postprocedural states: Secondary | ICD-10-CM

## 2013-11-29 DIAGNOSIS — M79662 Pain in left lower leg: Secondary | ICD-10-CM

## 2013-11-29 DIAGNOSIS — D3610 Benign neoplasm of peripheral nerves and autonomic nervous system, unspecified: Secondary | ICD-10-CM

## 2013-11-29 NOTE — Telephone Encounter (Signed)
Referral was made for a Lower Venous Doppler to rule out DVT as soon as possible.  It was scheduled for 12/03/2013 at 2:30pm.  Will call if anything is available for Monday, 12/02/2013.

## 2013-12-03 ENCOUNTER — Ambulatory Visit (HOSPITAL_COMMUNITY)
Admission: RE | Admit: 2013-12-03 | Discharge: 2013-12-03 | Disposition: A | Discharge status: Home / Self Care | Payer: BC Managed Care – PPO | Source: Ambulatory Visit | Attending: Cardiology | Admitting: Cardiology

## 2013-12-03 DIAGNOSIS — M79662 Pain in left lower leg: Secondary | ICD-10-CM | POA: Diagnosis not present

## 2013-12-03 NOTE — Progress Notes (Signed)
Left Lower Ext. Venous Duplex Completed. Negative for DVT and SVT.  Oda Cogan, BS, RDMS, RVT

## 2013-12-03 NOTE — Progress Notes (Signed)
Subjective: Patient presents today 2 weeks status post foot surgery of the left foot. Date of surgery 11-13-13 consisting of a neuroma excision plantar left foot 2nd interspace. Patient denies nausea, vomiting, fevers, chills or night sweats. States she is experiencing some swelling with calf pain and  tenderness to the operative side.   Objective: Neurovascular status is intact with palpable pedal pulses DP and PT bilateral at 2+ out of 4. Neurological sensation is intact and unchanged as per prior to surgery. Excellent appearance of the postoperative foot is noted. Incision site is well coapted with sutures in place. No redness, swelling or sign of infection is present. Small amount of zinging pain is present dorsum left foot at the 3rd metatarsal head.  Swelling and pain on the left calf is noted with compression. No palpable nodes noted.  No calor is appreciated.   Assessment: Status post left foot plantar neuroma excision ; rule out dvt left.   Plan: today we removed every other suture. She can get the foot wet. We are ordering her a ultrasound to rule out dvt. She will ice the foot and calf. I will then see her back in one following week and we will remove the remainder of the sutures.

## 2013-12-04 ENCOUNTER — Ambulatory Visit (INDEPENDENT_AMBULATORY_CARE_PROVIDER_SITE_OTHER): Payer: BC Managed Care – PPO | Admitting: Podiatrist

## 2013-12-04 ENCOUNTER — Encounter: Payer: Self-pay | Admitting: Podiatrist

## 2013-12-04 VITALS — BP 125/67 | HR 64 | Resp 16

## 2013-12-04 DIAGNOSIS — Z9889 Other specified postprocedural states: Secondary | ICD-10-CM

## 2013-12-04 DIAGNOSIS — T8140XA Infection following a procedure, unspecified, initial encounter: Secondary | ICD-10-CM

## 2013-12-04 DIAGNOSIS — D3610 Benign neoplasm of peripheral nerves and autonomic nervous system, unspecified: Secondary | ICD-10-CM

## 2013-12-04 MED ORDER — AMOXICILLIN-POT CLAVULANATE 875-125 MG PO TABS: 1.0000 | ORAL_TABLET | Freq: Two times a day (BID) | ORAL | Status: DC

## 2013-12-04 MED ORDER — CIPROFLOXACIN HCL 500 MG PO TABS: 500.0000 mg | ORAL_TABLET | Freq: Two times a day (BID) | ORAL | Status: DC

## 2013-12-04 NOTE — Progress Notes (Signed)
   Subjective:    Patient ID: Christine Salazar, female    DOB: 05/04/1960, 53 y.o.   MRN: [id number]  HPI  Pt presents as a post op problem, stated she did a lot of walking and thought her foot was bleeding, wound edges aligned, slightly swollen, sutures in tact  Review of Systems     Objective:   Physical Exam Neurovascular status intact.  Suture edges are gapped in the central aspect of the incision.  Minimal amount of drainage is expressed with compression.  No malodor is present, no streaking is noted.  Mild redness peri incision is present.  Central sutures are stil intact    Assessment & Plan:  Neuroma resection with post op cellulitus  Plan:  Redressed the foot in a dry, sterile, comprssive dressing and recommended epsom salt soaks twice daily.  She will continue to dress the foot and continue to wear the darco shoe.  I started Hollin on Augmentin and cipro for 10 days.  I will see her back next week.  Result of the ultrasound was negative for DVT.  Likely the wedge shoe is causing discomfort from walking.  She will call if she notices any incresed, redness, swelling, drainage, malaise, fevers, chills or other signs of infection.

## 2013-12-11 ENCOUNTER — Ambulatory Visit (INDEPENDENT_AMBULATORY_CARE_PROVIDER_SITE_OTHER): Payer: BC Managed Care – PPO | Admitting: Podiatrist

## 2013-12-11 ENCOUNTER — Encounter: Payer: Self-pay | Admitting: Podiatrist

## 2013-12-11 VITALS — BP 118/79 | HR 80 | Resp 12

## 2013-12-11 DIAGNOSIS — Z9889 Other specified postprocedural states: Secondary | ICD-10-CM

## 2013-12-11 DIAGNOSIS — D3610 Benign neoplasm of peripheral nerves and autonomic nervous system, unspecified: Secondary | ICD-10-CM

## 2013-12-12 NOTE — Progress Notes (Signed)
Subjective: Christine Salazar presents today for postop followup and for removal of remainder of sutures. She had a stump neuroma removed from the third interspace of the left foot plantar approach. She states she's doing well. She does have a slight amount of drainage still on her dressing. No pain is reported.  Objective: Neurovascular status intact and unchanged. Incision site appears significantly improved from the last visit. No active pus or purulence is noted. A scant amount of bloody drainage is present With the sutures are removed. Overall incision site appears to be coapting well. Pain continues at the area of the site status post surgery  Assessment: Status post neuroma surgery left foot  Plan: Remainder of sutures are removed at today's visit without complication. She will continue to wear her Darco shoe and to stay off of her foot. I will see her back in 2 weeks for recheck.

## 2013-12-16 ENCOUNTER — Encounter: Payer: Self-pay | Admitting: Podiatrist

## 2013-12-20 ENCOUNTER — Encounter: Payer: Self-pay | Admitting: Podiatrist

## 2013-12-20 ENCOUNTER — Ambulatory Visit (INDEPENDENT_AMBULATORY_CARE_PROVIDER_SITE_OTHER): Payer: BC Managed Care – PPO | Admitting: Podiatrist

## 2013-12-20 VITALS — BP 128/88 | HR 86 | Temp 98.8°F | Resp 14

## 2013-12-20 DIAGNOSIS — Z9889 Other specified postprocedural states: Secondary | ICD-10-CM

## 2013-12-20 MED ORDER — CEPHALEXIN 500 MG PO CAPS: 500.0000 mg | ORAL_CAPSULE | Freq: Three times a day (TID) | ORAL | Status: DC

## 2013-12-20 NOTE — Patient Instructions (Signed)
I have called in another antibiotic to your pharmacy-- take this medication 3 times a day for 10 days.  Keep applying a bandage to your foot.  If you continue to see pus please call.

## 2013-12-24 NOTE — Progress Notes (Signed)
Subjective: Christine Salazar presents today for postop followup and for continued drainage at the incision site of the left foot. She had a stump neuroma removed from the third interspace of the left foot plantar approach. She states that her foot began to drain and she was concerned.  Objective: Neurovascular status intact and unchanged. Incision site has some overlying redundant skin which is debrided today without complication. No pus pocket is noted. No probing is noted. Minor area of drainage is seen but no No active pus or purulence is noted or expressed with compression..    Assessment: Status post neuroma surgery left foot/postoperative drainage  Plan: debrided at the incision site today without complication. Applied antibiotic ointment and a dressing. I will see her back in a week for recheck.

## 2013-12-25 ENCOUNTER — Encounter: Payer: Self-pay | Admitting: Podiatrist

## 2013-12-25 ENCOUNTER — Ambulatory Visit (INDEPENDENT_AMBULATORY_CARE_PROVIDER_SITE_OTHER): Payer: BC Managed Care – PPO | Admitting: Podiatrist

## 2013-12-25 VITALS — BP 130/84 | HR 88 | Resp 12

## 2013-12-25 DIAGNOSIS — D3610 Benign neoplasm of peripheral nerves and autonomic nervous system, unspecified: Secondary | ICD-10-CM

## 2013-12-25 DIAGNOSIS — Z9889 Other specified postprocedural states: Secondary | ICD-10-CM

## 2013-12-31 NOTE — Progress Notes (Signed)
  Subjective: Christine Salazar presents today for postop followup and for follow-upof I and D for continued drainage at the incision site of the left foot. She had a stump neuroma removed from the third interspace of the left foot plantar approach. He states her foot is no longer draining however continues to be sore and tender from surgery.  Objective: Neurovascular status intact and unchanged. Incision site Looks to be healing nicely. No redness, no swelling, no sign of infection is present. Opus pocket is expressed. No drainage is expressed.  Assessment: Status post neuroma surgery left foot  Plan: Recommended continuing with a good supportive shoe and a pad to offload the incision site. This was dispensed for her use today. I'll see her back in 4 weeks for recheck. If any concerns arise prior to that visit she is instructed to call immediately.

## 2014-01-15 ENCOUNTER — Encounter: Payer: BC Managed Care – PPO | Admitting: Podiatrist

## 2014-01-15 ENCOUNTER — Ambulatory Visit (INDEPENDENT_AMBULATORY_CARE_PROVIDER_SITE_OTHER): Payer: BC Managed Care – PPO | Admitting: Podiatrist

## 2014-01-15 VITALS — BP 133/92 | HR 96 | Resp 12

## 2014-01-15 DIAGNOSIS — Z9889 Other specified postprocedural states: Secondary | ICD-10-CM

## 2014-01-15 DIAGNOSIS — D3610 Benign neoplasm of peripheral nerves and autonomic nervous system, unspecified: Secondary | ICD-10-CM

## 2014-01-27 NOTE — Progress Notes (Signed)
  Subjective: Christine Salazar presents today for postop followup and for follow-upof I and D for continued drainage at the incision site of the left foot. She had a stump neuroma removed from the third interspace of the left foot plantar approach. He states her foot is no longer draining however continues to be sore and tender from surgery.  Objective: Neurovascular status intact and unchanged. Incision site Looks to be healing nicely. No redness, no swelling, no sign of infection is present.   Assessment: Status post neuroma surgery left foot  Plan: Recommended continuing with a good supportive shoe and a pad to offload the incision site. This was dispensed for her use today. I'll see her back in 4 weeks for recheck. If any concerns arise prior to that visit she is instructed to call immediately.

## 2014-02-12 ENCOUNTER — Ambulatory Visit (INDEPENDENT_AMBULATORY_CARE_PROVIDER_SITE_OTHER): Payer: BC Managed Care – PPO | Admitting: Podiatrist

## 2014-02-12 ENCOUNTER — Encounter: Payer: Self-pay | Admitting: Podiatrist

## 2014-02-12 VITALS — BP 116/83 | HR 90 | Resp 16

## 2014-02-12 DIAGNOSIS — Z9889 Other specified postprocedural states: Secondary | ICD-10-CM

## 2014-02-12 DIAGNOSIS — D3610 Benign neoplasm of peripheral nerves and autonomic nervous system, unspecified: Secondary | ICD-10-CM

## 2014-02-12 MED ORDER — PREGABALIN 150 MG PO CAPS: 150.0000 mg | ORAL_CAPSULE | Freq: Every day | ORAL | Status: DC

## 2014-02-12 NOTE — Progress Notes (Signed)
Subjective: Patient presents today for postop follow-up regarding left foot surgery where we removed a stump neuroma second interspace of the plantar aspect of the left foot. She states she's doing OK she still has some numbness and tingling on the top and bottom of the foot. She would like to return to work in January however she states her foot is still bothersome.  Objective: Neurovascular status unchanged the incision site is well-healed on the plantar aspect of the foot there is some scar tissue and hyperkeratotic tissue present which is causing pain. Some tingling and numbness is present between the second interspace of the left foot as well.  Assessment: Status post neurectomy plantar incision left foot with residual scar tissue and numbness and burning.   Plan: Debridement of the hyperkeratotic tissue was accomplished today without complication. Applied a silicone sheeting to the foot and bandaged with tape. Surgery to continue wearing the silicone sheeting to help the scar tissue lay down. I'll see her back in 1 month for follow-up. Recommended that she return to work on January 18 and it was written so that she may do so. If she has pain she'll call.

## 2014-03-12 ENCOUNTER — Ambulatory Visit (INDEPENDENT_AMBULATORY_CARE_PROVIDER_SITE_OTHER): Payer: BLUE CROSS/BLUE SHIELD | Admitting: Podiatrist

## 2014-03-12 ENCOUNTER — Encounter: Payer: Self-pay | Admitting: Podiatrist

## 2014-03-12 VITALS — BP 126/73 | HR 84 | Resp 16

## 2014-03-12 DIAGNOSIS — Z9889 Other specified postprocedural states: Secondary | ICD-10-CM

## 2014-03-12 DIAGNOSIS — D3610 Benign neoplasm of peripheral nerves and autonomic nervous system, unspecified: Secondary | ICD-10-CM

## 2014-03-19 NOTE — Progress Notes (Signed)
Subjective: Patient presents today for postop follow-up regarding left foot surgery where we removed a stump neuroma second interspace of the plantar aspect of the left foot. She states she's doing great.  She states the foot is feeling well and she would like to return to work as soon as she is able to go back.    Objective: Neurovascular status unchanged the incision site is well-healed on the plantar aspect of the foot there is some scar tissue and hyperkeratotic tissue present which is causing pain. Some residual but greatly improved tingling and numbness is present between the second interspace of the left foot as well.  Assessment: Status post neurectomy plantar incision left foot with residual scar tissue and numbness and burning.   Plan: Debridement of the hyperkeratotic tissue was accomplished today without complication. Recommended she stay off her foot for 2 more weeks to keep the scar tissue from reforming then she may return to work.  A note was written to reflect this recommendation.  If she has any concerns in the future she will call

## 2016-08-18 DIAGNOSIS — H903 Sensorineural hearing loss, bilateral: Secondary | ICD-10-CM | POA: Insufficient documentation

## 2016-11-10 DIAGNOSIS — S01309A Unspecified open wound of unspecified ear, initial encounter: Secondary | ICD-10-CM | POA: Insufficient documentation

## 2016-12-26 DIAGNOSIS — L299 Pruritus, unspecified: Secondary | ICD-10-CM | POA: Insufficient documentation

## 2019-04-10 ENCOUNTER — Emergency Department (HOSPITAL_COMMUNITY): Payer: 59

## 2019-04-10 ENCOUNTER — Encounter (HOSPITAL_COMMUNITY): Payer: Self-pay | Admitting: Emergency Medicine

## 2019-04-10 ENCOUNTER — Emergency Department (HOSPITAL_COMMUNITY)
Admission: EM | Admit: 2019-04-10 | Discharge: 2019-04-11 | Disposition: A | Discharge status: Home / Self Care | Payer: 59 | Attending: Emergency Medicine | Admitting: Emergency Medicine

## 2019-04-10 ENCOUNTER — Other Ambulatory Visit: Payer: Self-pay

## 2019-04-10 DIAGNOSIS — F4324 Adjustment disorder with disturbance of conduct: Secondary | ICD-10-CM | POA: Diagnosis not present

## 2019-04-10 DIAGNOSIS — Z20822 Contact with and (suspected) exposure to covid-19: Secondary | ICD-10-CM | POA: Insufficient documentation

## 2019-04-10 DIAGNOSIS — F6381 Intermittent explosive disorder: Secondary | ICD-10-CM | POA: Diagnosis not present

## 2019-04-10 DIAGNOSIS — Z79899 Other long term (current) drug therapy: Secondary | ICD-10-CM | POA: Diagnosis not present

## 2019-04-10 DIAGNOSIS — R45850 Homicidal ideations: Secondary | ICD-10-CM

## 2019-04-10 DIAGNOSIS — I10 Essential (primary) hypertension: Secondary | ICD-10-CM | POA: Diagnosis not present

## 2019-04-10 DIAGNOSIS — Z046 Encounter for general psychiatric examination, requested by authority: Secondary | ICD-10-CM | POA: Diagnosis present

## 2019-04-10 LAB — COMPREHENSIVE METABOLIC PANEL
ALT: 33 U/L (ref 0–44)
AST: 27 U/L (ref 15–41)
Albumin: 4.3 g/dL (ref 3.5–5.0)
Alkaline Phosphatase: 92 U/L (ref 38–126)
Anion gap: 11 (ref 5–15)
BUN: 27 mg/dL — ABNORMAL HIGH (ref 6–20)
CO2: 26 mmol/L (ref 22–32)
Calcium: 10.2 mg/dL (ref 8.9–10.3)
Chloride: 102 mmol/L (ref 98–111)
Creatinine, Ser: 1.44 mg/dL — ABNORMAL HIGH (ref 0.44–1.00)
GFR calc Af Amer: 46 mL/min — ABNORMAL LOW (ref >60)
GFR calc non Af Amer: 40 mL/min — ABNORMAL LOW (ref >60)
Glucose, Bld: 115 mg/dL — ABNORMAL HIGH (ref 70–99)
Potassium: 4.2 mmol/L (ref 3.5–5.1)
Sodium: 139 mmol/L (ref 135–145)
Total Bilirubin: 0.5 mg/dL (ref 0.3–1.2)
Total Protein: 8.9 g/dL — ABNORMAL HIGH (ref 6.5–8.1)

## 2019-04-10 LAB — CBC WITH DIFFERENTIAL/PLATELET
Abs Immature Granulocytes: 0.04 10*3/uL (ref 0.00–0.07)
Basophils Absolute: 0 10*3/uL (ref 0.0–0.1)
Basophils Relative: 0 %
Eosinophils Absolute: 0.1 10*3/uL (ref 0.0–0.5)
Eosinophils Relative: 1 %
HCT: 47.6 % — ABNORMAL HIGH (ref 36.0–46.0)
Hemoglobin: 15.6 g/dL — ABNORMAL HIGH (ref 12.0–15.0)
Immature Granulocytes: 0 %
Lymphocytes Relative: 20 %
Lymphs Abs: 2.2 10*3/uL (ref 0.7–4.0)
MCH: 31.6 pg (ref 26.0–34.0)
MCHC: 32.8 g/dL (ref 30.0–36.0)
MCV: 96.6 fL (ref 80.0–100.0)
Monocytes Absolute: 0.7 10*3/uL (ref 0.1–1.0)
Monocytes Relative: 6 %
Neutro Abs: 8 10*3/uL — ABNORMAL HIGH (ref 1.7–7.7)
Neutrophils Relative %: 73 %
Platelets: 352 10*3/uL (ref 150–400)
RBC: 4.93 MIL/uL (ref 3.87–5.11)
RDW: 12.5 % (ref 11.5–15.5)
WBC: 11.1 10*3/uL — ABNORMAL HIGH (ref 4.0–10.5)
nRBC: 0 % (ref 0.0–0.2)

## 2019-04-10 LAB — URINALYSIS, ROUTINE W REFLEX MICROSCOPIC
Bilirubin Urine: NEGATIVE
Glucose, UA: NEGATIVE mg/dL
Hgb urine dipstick: NEGATIVE
Ketones, ur: NEGATIVE mg/dL
Nitrite: NEGATIVE
Protein, ur: NEGATIVE mg/dL
Specific Gravity, Urine: 1.014 (ref 1.005–1.030)
pH: 5 (ref 5.0–8.0)

## 2019-04-10 LAB — RAPID URINE DRUG SCREEN, HOSP PERFORMED
Amphetamines: NOT DETECTED
Barbiturates: NOT DETECTED
Benzodiazepines: NOT DETECTED
Cocaine: NOT DETECTED
Opiates: NOT DETECTED
Tetrahydrocannabinol: NOT DETECTED

## 2019-04-10 LAB — RESPIRATORY PANEL BY RT PCR (FLU A&B, COVID)
Influenza A by PCR: NEGATIVE
Influenza B by PCR: NEGATIVE
SARS Coronavirus 2 by RT PCR: NEGATIVE

## 2019-04-10 LAB — ETHANOL: Alcohol, Ethyl (B): <10 mg/dL (ref <10)

## 2019-04-10 LAB — SALICYLATE LEVEL: Salicylate Lvl: <7 mg/dL — ABNORMAL LOW (ref 7.0–30.0)

## 2019-04-10 LAB — ACETAMINOPHEN LEVEL: Acetaminophen (Tylenol), Serum: <10 ug/mL — ABNORMAL LOW (ref 10–30)

## 2019-04-10 MED ORDER — SPIRONOLACTONE-HCTZ 25-25 MG PO TABS
1.0000 | ORAL_TABLET | Freq: Every morning | ORAL | Status: DC
  Filled 2019-04-10: qty 1

## 2019-04-10 MED ORDER — CARVEDILOL 12.5 MG PO TABS
12.5000 mg | ORAL_TABLET | Freq: Two times a day (BID) | ORAL | Status: DC
  Administered 2019-04-10 – 2019-04-11 (×2): 12.5 mg via ORAL
  Filled 2019-04-10 (×3): qty 1

## 2019-04-10 MED ORDER — AMLODIPINE BESYLATE 5 MG PO TABS
5.0000 mg | ORAL_TABLET | Freq: Every day | ORAL | Status: DC
  Administered 2019-04-10 – 2019-04-11 (×2): 5 mg via ORAL
  Filled 2019-04-10 (×2): qty 1
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
## 2019-04-10 NOTE — ED Notes (Signed)
Patients brother left contact info.  Kathreen Cornfield  [phone].

## 2019-04-11 MED ORDER — HYDROCHLOROTHIAZIDE 25 MG PO TABS
25.0000 mg | ORAL_TABLET | Freq: Every day | ORAL | Status: DC
  Administered 2019-04-11: 25 mg via ORAL
  Filled 2019-04-11: qty 1

## 2019-04-11 MED ORDER — SPIRONOLACTONE 25 MG PO TABS
25.0000 mg | ORAL_TABLET | Freq: Every day | ORAL | Status: DC
  Administered 2019-04-11: 25 mg via ORAL
  Filled 2019-04-11: qty 1

## 2019-04-11 NOTE — Progress Notes (Signed)
TOC consult received regarding patient's family's expressed concerns about patient's behaviors at home. CSW spoke with patient's brother Ulice Dash [phone]). Patient lives with mother. Mother just got out of the hospital after a month recovering from a stroke. Concern about patient's behaviors starting a month and a half ago (which is when mother's medical issues began). Ulice Dash reports he and one of patient's sister have been here to assist with mom (there are 5 children in total). Ulice Dash reports a hx of patient becoming resentful when they are in the home for extended periods of time as if they are trying to control things in her space. Ulice Dash reports they are open to patient to helping, but when it comes to it patient becomes disengaged.   CSW spoke with Ulice Dash about patient returning home as it is her home. Ulice Dash reports he understands and the family has thought about patient going to an aunt's house to have a break, but says the home does have some "unsovory" people around there so it may not be the best option. Ulice Dash report he and another family member will pick patient up when she is ready for discharge.   CSW spoke with patient via phone and let he know that her family is willing to let her come back home. CSW encouraged patient to follow up with [facility] to finds ways to better cope with the change going on within her home and with her family. Patient stated she understood and would give it some thought. CSW also spoke with patient about the importance of keeping herself and her family safe.   Golden Circle, LCSW Transitions of Care Department [facility] ED [phone]
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
## 2019-04-11 NOTE — Discharge Instructions (Signed)
For your mental health needs, you are advised to follow up with [facility].  Call them at your earliest opportunity to schedule an intake appointment:       [facility]      [address]      [place], [place] [address]      [phone]      Crisis number: [phone]  

## 2019-04-11 NOTE — ED Notes (Signed)
Pt DCd off unit to home per provider. Pt calm,cooperative, no s/s of distress. DC information given to and reviewed with pt, pt acknowledged understanding. Pt ambulatory off the unit, escorted by [facility]. Pt transported by family member.

## 2019-04-11 NOTE — BH Assessment (Signed)
[facility] Assessment Progress Note  Per Hampton Abbot, MD, this pt does not require psychiatric hospitalization at this time.  Pt is to be discharged from [facility] with recommendation to follow up with [facility].  This has been included in pt's discharge instructions.  Pt's nurse has been notified.  Jalene Mullet, [place] Triage Specialist [phone]
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
## 2019-10-14 ENCOUNTER — Encounter: Payer: Self-pay | Admitting: Surgery

## 2019-10-14 DIAGNOSIS — K5900 Constipation, unspecified: Secondary | ICD-10-CM | POA: Insufficient documentation

## 2019-10-14 DIAGNOSIS — K5909 Other constipation: Secondary | ICD-10-CM | POA: Insufficient documentation

## 2020-07-10 IMAGING — CT CT HEAD W/O CM
3 series · 15 of 47 positions shown, 18 images · non-contrast
Comparison: None.

CLINICAL DATA: 58-year-old female with altered mental status.

EXAM:
CT HEAD WITHOUT CONTRAST
TECHNIQUE: Contiguous axial images were obtained from the base of the skull
through the vertex without intravenous contrast.

[Series 2: head wo · axial · 0.47mm/px · z∈[-145,-10]mm · 9 of 33 slices shown, 12 images]
[im 3/33  brain]
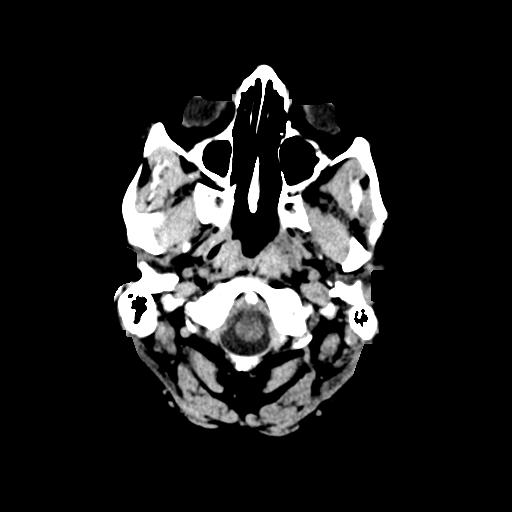
[im 3/33  bone]
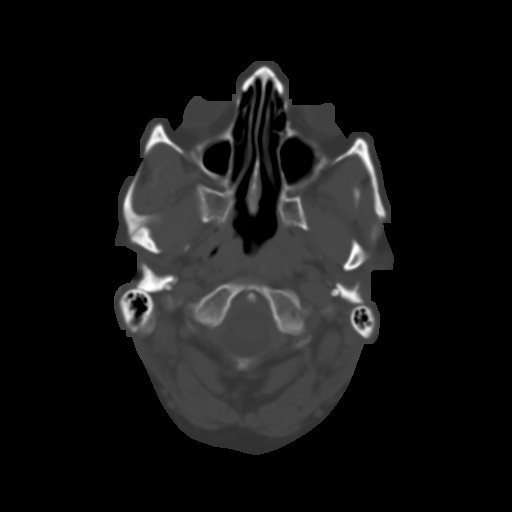
[im 6/33  brain]
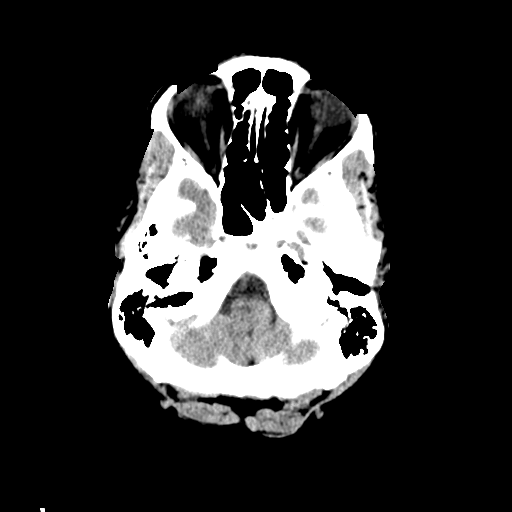
[im 9/33  brain]
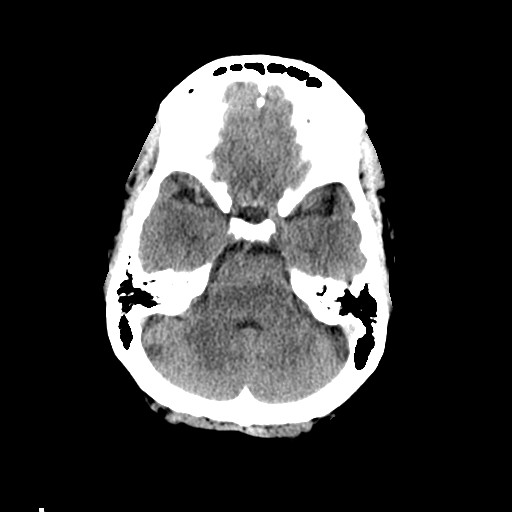
[im 13/33  brain]
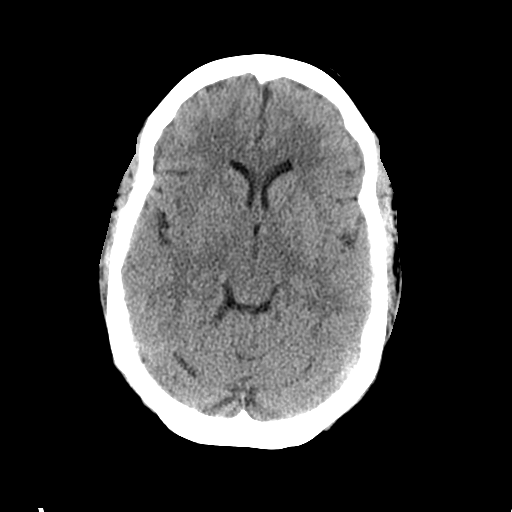
[im 17/33  brain]
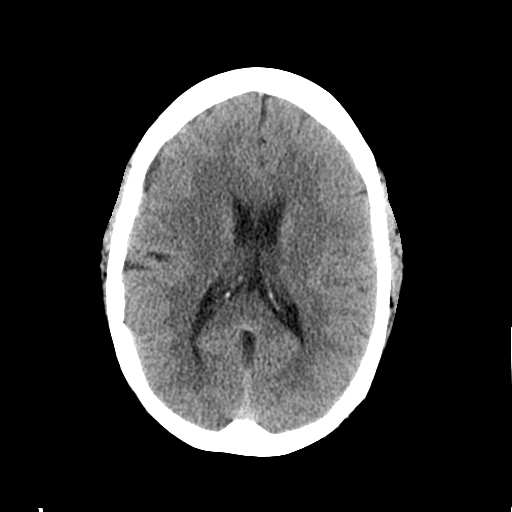
[im 17/33  bone]
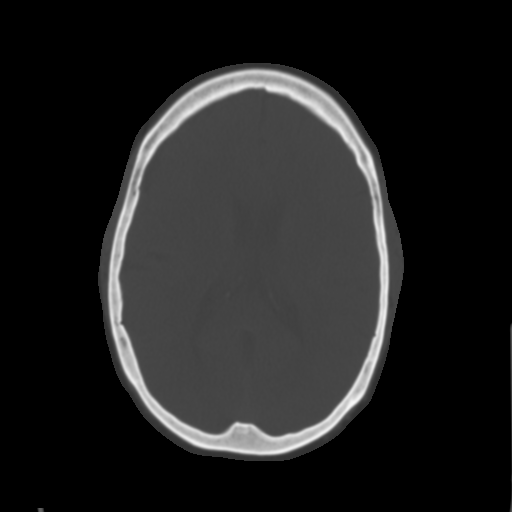
[im 20/33  brain]
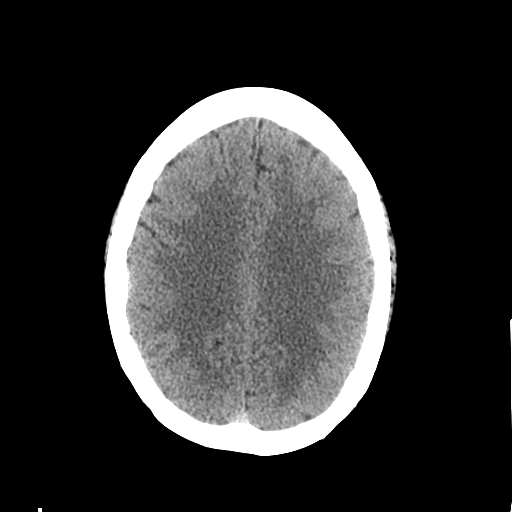
[im 24/33  brain]
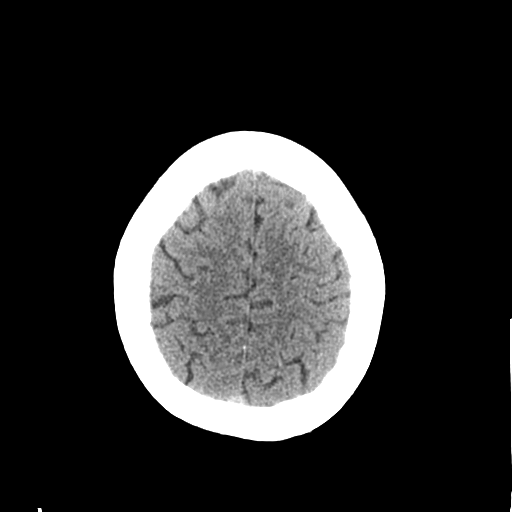
[im 27/33  brain]
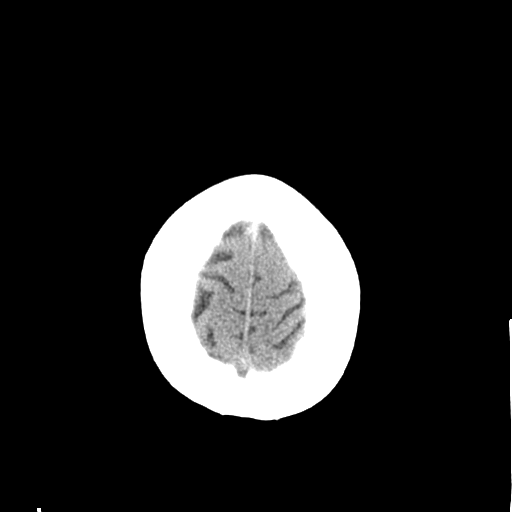
[im 30/33  brain]
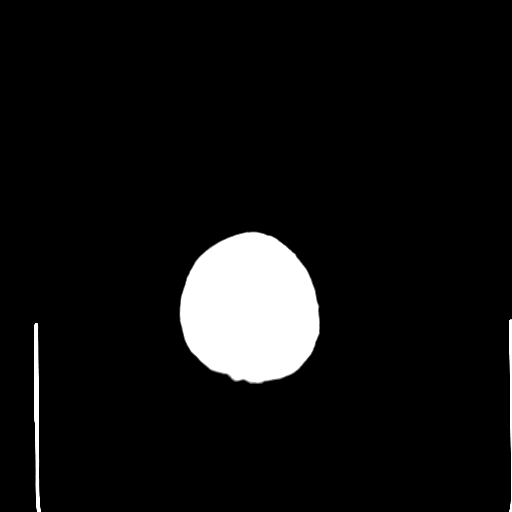
[im 30/33  bone]
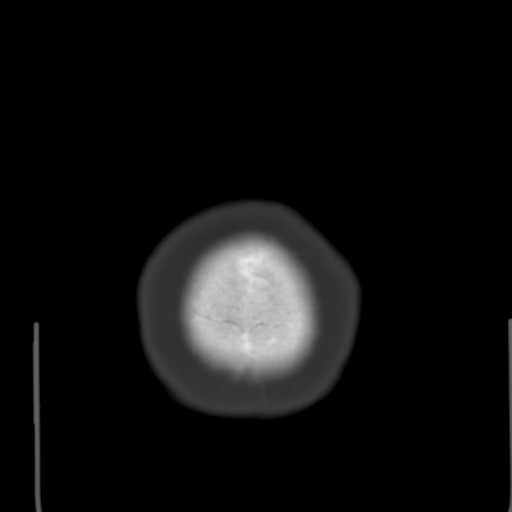

[Series 4: coronal soft tissue · coronal · 0.34mm/px · 3 of 63 slices shown]
[im 21/63  brain]
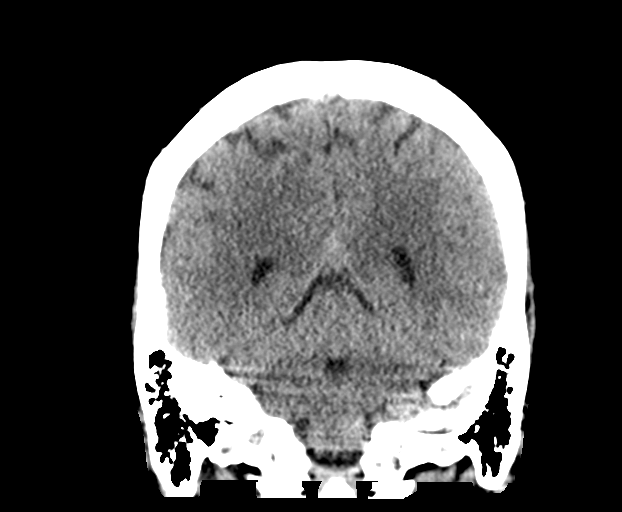
[im 28/63  brain]
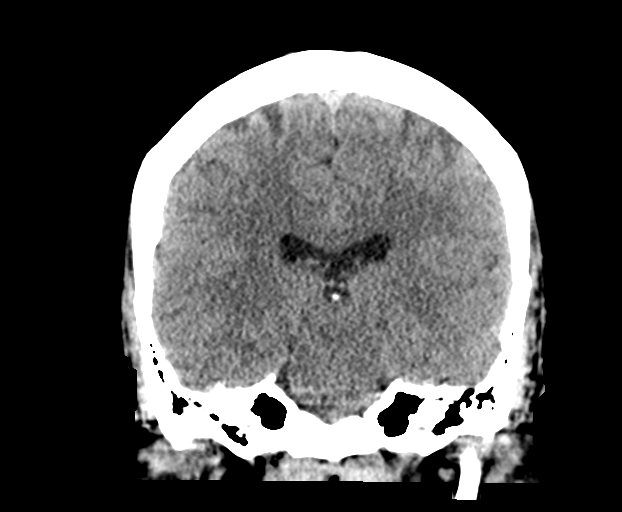
[im 35/63  brain]
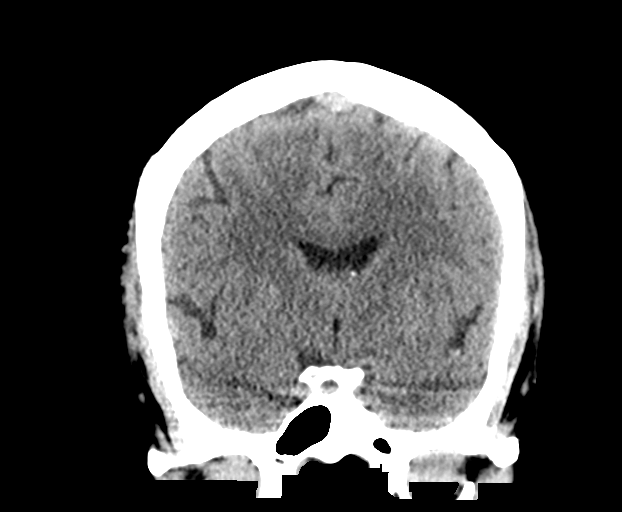

[Series 5: sagittal soft tissue · sagittal · 0.35mm/px · 3 of 52 slices shown]
[im 18/52  brain]
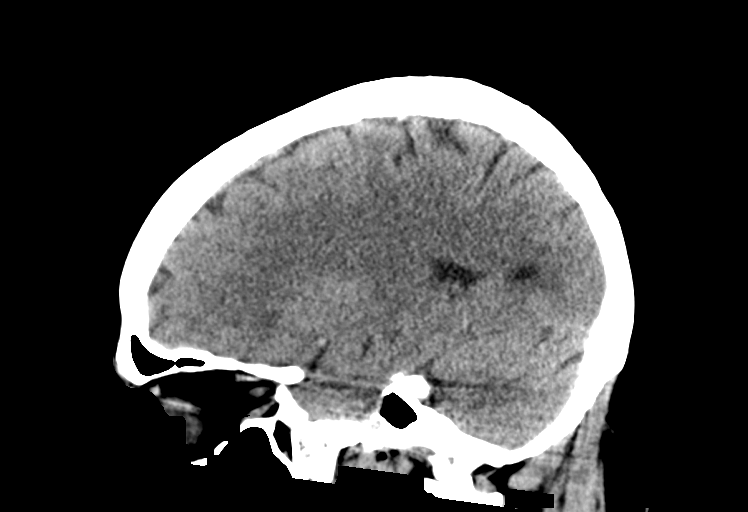
[im 26/52  brain]
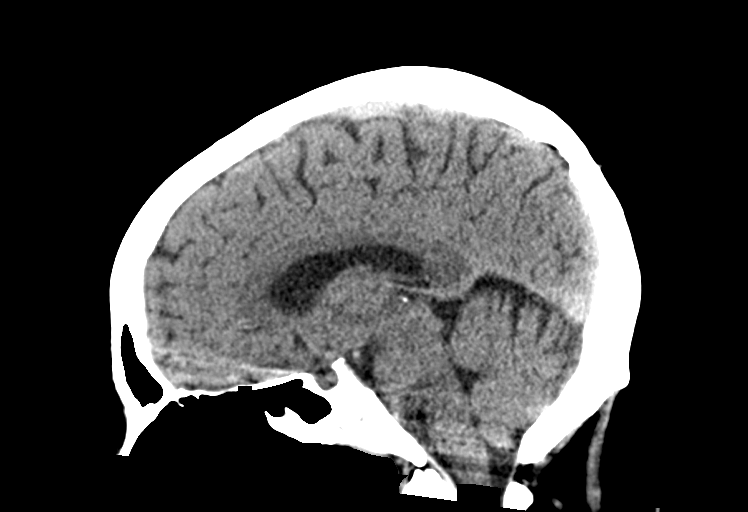
[im 35/52  brain]
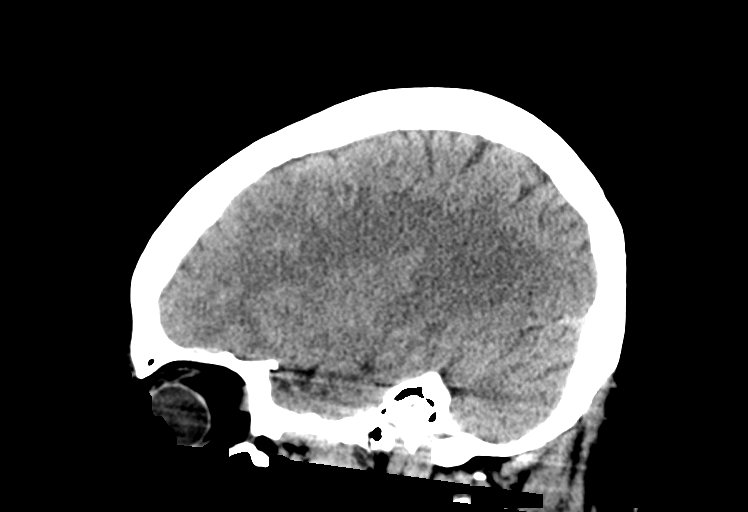

[15 of 47 positions shown; findings below may reference images not displayed]

FINDINGS: Brain: The ventricles and sulci appropriate size for patient's age.
The gray-white matter discrimination is preserved. There is no acute
intracranial hemorrhage. No mass effect or midline shift. No
extra-axial fluid collection.

Vascular: No hyperdense vessel or unexpected calcification.

Skull: Normal. Negative for fracture or focal lesion.

Sinuses/Orbits: No acute finding.

Other: None
IMPRESSION: No acute intracranial pathology.

## 2020-07-10 IMAGING — DX DG CHEST 1V PORT
1 series · 1 of 1 positions shown · non-contrast
Comparison: 03/31/2010

CLINICAL DATA: Altered mental status

EXAM:
PORTABLE CHEST 1 VIEW

[chest ap]
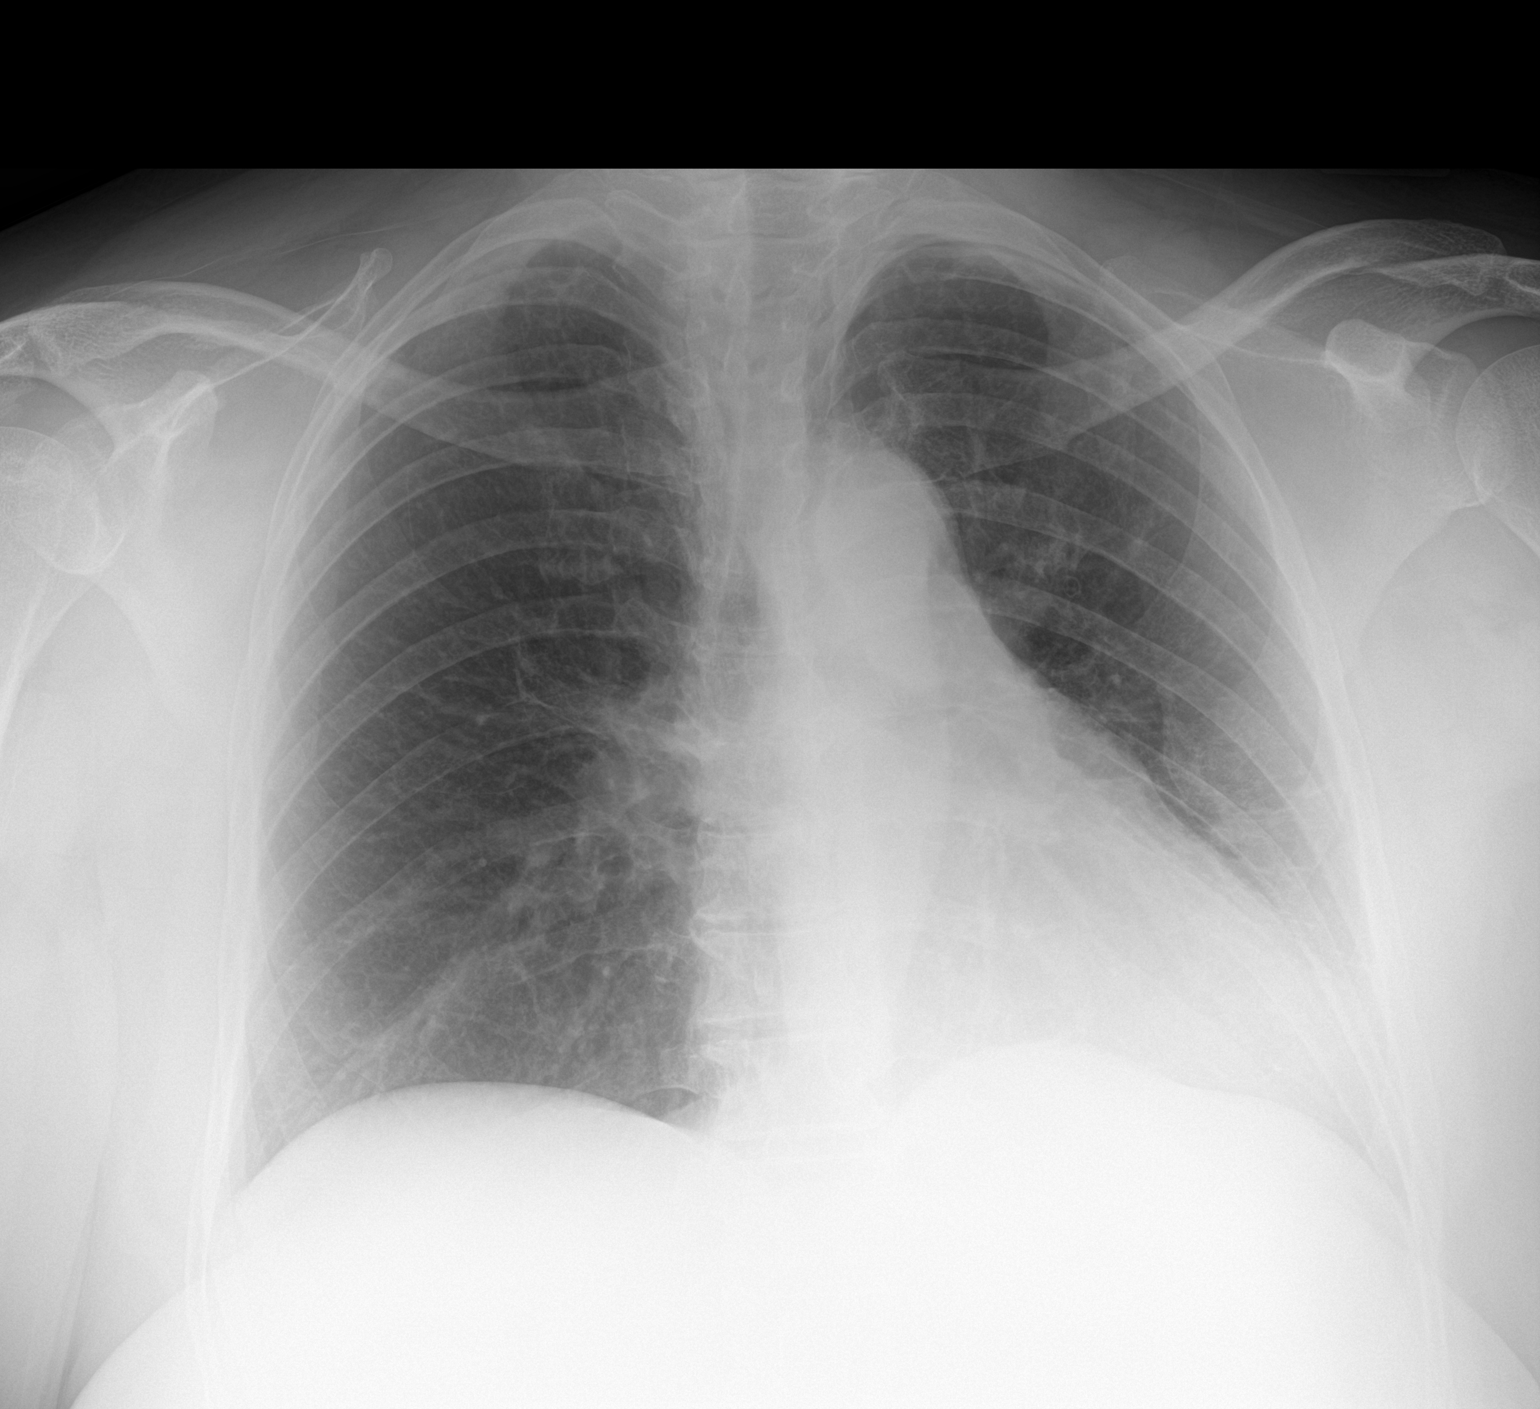

[1 of 1 positions shown; findings below may reference images not displayed]

FINDINGS: No focal consolidation or effusion. Borderline cardiomegaly. No
pneumothorax. Mild distortion at the hila without change since 1131.
IMPRESSION: No active disease. Borderline cardiomegaly. Subtle chronic hilar
distortion since 1131, at which time possible sarcoid with
suggested.

## 2021-12-17 ENCOUNTER — Other Ambulatory Visit: Payer: Self-pay

## 2021-12-17 ENCOUNTER — Inpatient Hospital Stay (HOSPITAL_COMMUNITY)
Admission: EM | Admit: 2021-12-17 | Discharge: 2021-12-20 | DRG: 312 | Disposition: A | Discharge status: Home / Self Care | Payer: Commercial Managed Care - HMO | Attending: Internal Medicine | Admitting: Internal Medicine

## 2021-12-17 DIAGNOSIS — R4182 Altered mental status, unspecified: Secondary | ICD-10-CM | POA: Diagnosis present

## 2021-12-17 DIAGNOSIS — T17908A Unspecified foreign body in respiratory tract, part unspecified causing other injury, initial encounter: Secondary | ICD-10-CM | POA: Diagnosis present

## 2021-12-17 DIAGNOSIS — N3000 Acute cystitis without hematuria: Secondary | ICD-10-CM

## 2021-12-17 DIAGNOSIS — I452 Bifascicular block: Secondary | ICD-10-CM | POA: Diagnosis present

## 2021-12-17 DIAGNOSIS — K5790 Diverticulosis of intestine, part unspecified, without perforation or abscess without bleeding: Secondary | ICD-10-CM | POA: Diagnosis present

## 2021-12-17 DIAGNOSIS — R001 Bradycardia, unspecified: Secondary | ICD-10-CM | POA: Diagnosis present

## 2021-12-17 DIAGNOSIS — I129 Hypertensive chronic kidney disease with stage 1 through stage 4 chronic kidney disease, or unspecified chronic kidney disease: Secondary | ICD-10-CM | POA: Diagnosis present

## 2021-12-17 DIAGNOSIS — K449 Diaphragmatic hernia without obstruction or gangrene: Secondary | ICD-10-CM | POA: Diagnosis present

## 2021-12-17 DIAGNOSIS — E876 Hypokalemia: Secondary | ICD-10-CM | POA: Diagnosis present

## 2021-12-17 DIAGNOSIS — Z1152 Encounter for screening for COVID-19: Secondary | ICD-10-CM

## 2021-12-17 DIAGNOSIS — K5909 Other constipation: Secondary | ICD-10-CM | POA: Diagnosis present

## 2021-12-17 DIAGNOSIS — N1832 Chronic kidney disease, stage 3b: Secondary | ICD-10-CM | POA: Diagnosis present

## 2021-12-17 DIAGNOSIS — Z823 Family history of stroke: Secondary | ICD-10-CM

## 2021-12-17 DIAGNOSIS — I10 Essential (primary) hypertension: Secondary | ICD-10-CM | POA: Diagnosis present

## 2021-12-17 DIAGNOSIS — R55 Syncope and collapse: Secondary | ICD-10-CM | POA: Diagnosis not present

## 2021-12-17 DIAGNOSIS — N179 Acute kidney failure, unspecified: Secondary | ICD-10-CM | POA: Diagnosis present

## 2021-12-17 DIAGNOSIS — I4510 Unspecified right bundle-branch block: Secondary | ICD-10-CM

## 2021-12-17 DIAGNOSIS — I440 Atrioventricular block, first degree: Secondary | ICD-10-CM

## 2021-12-17 DIAGNOSIS — K921 Melena: Secondary | ICD-10-CM

## 2021-12-17 DIAGNOSIS — R739 Hyperglycemia, unspecified: Secondary | ICD-10-CM | POA: Diagnosis present

## 2021-12-17 DIAGNOSIS — Z79899 Other long term (current) drug therapy: Secondary | ICD-10-CM

## 2021-12-17 DIAGNOSIS — I959 Hypotension, unspecified: Secondary | ICD-10-CM | POA: Diagnosis present

## 2021-12-17 DIAGNOSIS — H9190 Unspecified hearing loss, unspecified ear: Secondary | ICD-10-CM | POA: Diagnosis present

## 2021-12-17 DIAGNOSIS — K5900 Constipation, unspecified: Secondary | ICD-10-CM

## 2021-12-17 DIAGNOSIS — K5641 Fecal impaction: Secondary | ICD-10-CM | POA: Diagnosis present

## 2021-12-17 HISTORY — DX: Syncope and collapse: R55

## 2021-12-17 HISTORY — DX: Unspecified right bundle-branch block: I45.10

## 2021-12-17 HISTORY — DX: Atrioventricular block, first degree: I44.0

## 2021-12-17 HISTORY — DX: Unspecified hearing loss, unspecified ear: H91.90

## 2021-12-17 HISTORY — DX: Lesion of plantar nerve, left lower limb: G57.62

## 2021-12-17 LAB — PROTIME-INR
INR: 1.1 (ref 0.8–1.2)
Prothrombin Time: 13.9 seconds (ref 11.4–15.2)

## 2021-12-17 LAB — APTT: aPTT: 21 seconds — ABNORMAL LOW (ref 24–36)

## 2021-12-17 LAB — CBG MONITORING, ED: Glucose-Capillary: 181 mg/dL — ABNORMAL HIGH (ref 70–99)

## 2021-12-17 MED ORDER — LACTATED RINGERS IV BOLUS (SEPSIS): 1000.0000 mL | Freq: Once | INTRAVENOUS | Status: DC

## 2021-12-17 MED ORDER — VANCOMYCIN HCL IN DEXTROSE 1-5 GM/200ML-% IV SOLN: 1000.0000 mg | Freq: Once | INTRAVENOUS | Status: DC

## 2021-12-17 MED ORDER — NALOXONE HCL 2 MG/2ML IJ SOSY
PREFILLED_SYRINGE | INTRAMUSCULAR | Status: AC
  Filled 2021-12-17: qty 2

## 2021-12-17 MED ORDER — LACTATED RINGERS IV SOLN: INTRAVENOUS | Status: DC

## 2021-12-17 MED ORDER — LACTATED RINGERS IV BOLUS (SEPSIS)
1000.0000 mL | Freq: Once | INTRAVENOUS | Status: AC
  Administered 2021-12-17: 1000 mL via INTRAVENOUS

## 2021-12-17 MED ORDER — NALOXONE HCL 2 MG/2ML IJ SOSY
2.0000 mg | PREFILLED_SYRINGE | Freq: Once | INTRAMUSCULAR | Status: AC
  Administered 2021-12-17: 2 mg via INTRAVENOUS

## 2021-12-17 MED ORDER — VANCOMYCIN HCL 1750 MG/350ML IV SOLN
1750.0000 mg | Freq: Once | INTRAVENOUS | Status: AC
  Administered 2021-12-18: 1750 mg via INTRAVENOUS
  Filled 2021-12-17: qty 350

## 2021-12-17 MED ORDER — ONDANSETRON HCL 4 MG/2ML IJ SOLN
INTRAMUSCULAR | Status: AC
  Filled 2021-12-17: qty 2

## 2021-12-17 MED ORDER — LACTATED RINGERS IV BOLUS (SEPSIS): 250.0000 mL | Freq: Once | INTRAVENOUS | Status: DC

## 2021-12-17 MED ORDER — METRONIDAZOLE 500 MG/100ML IV SOLN
500.0000 mg | Freq: Once | INTRAVENOUS | Status: AC
  Administered 2021-12-18: 500 mg via INTRAVENOUS
  Filled 2021-12-17: qty 100

## 2021-12-17 MED ORDER — SODIUM CHLORIDE 0.9 % IV SOLN
2.0000 g | Freq: Once | INTRAVENOUS | Status: AC
  Administered 2021-12-18: 2 g via INTRAVENOUS
  Filled 2021-12-17: qty 12.5

## 2021-12-17 MED ORDER — ONDANSETRON HCL 4 MG/2ML IJ SOLN
4.0000 mg | Freq: Once | INTRAMUSCULAR | Status: AC
  Administered 2021-12-17: 4 mg via INTRAVENOUS

## 2021-12-17 NOTE — ED Triage Notes (Signed)
BIB EMS for syncopal episode while attempting to use restroom. Found by family. Denies any pain. Per EMS patient seen too be diaphoretic, no radial pulse, 64/32 BP on scene, 84%RA, 68-70 hr, received 573m.

## 2021-12-17 NOTE — Sepsis Progress Note (Signed)
Elink monitoring for the code sepsis protocol.  

## 2021-12-18 ENCOUNTER — Emergency Department (HOSPITAL_COMMUNITY): Payer: Commercial Managed Care - HMO

## 2021-12-18 ENCOUNTER — Observation Stay (HOSPITAL_COMMUNITY): Payer: Commercial Managed Care - HMO

## 2021-12-18 ENCOUNTER — Inpatient Hospital Stay (HOSPITAL_COMMUNITY)
Admit: 2021-12-18 | Discharge: 2021-12-18 | Disposition: A | Discharge status: Home / Self Care | Payer: Commercial Managed Care - HMO | Attending: Internal Medicine | Admitting: Internal Medicine

## 2021-12-18 ENCOUNTER — Other Ambulatory Visit (HOSPITAL_COMMUNITY): Payer: Commercial Managed Care - HMO

## 2021-12-18 ENCOUNTER — Encounter (HOSPITAL_COMMUNITY): Payer: Self-pay | Admitting: Internal Medicine

## 2021-12-18 DIAGNOSIS — Z823 Family history of stroke: Secondary | ICD-10-CM | POA: Diagnosis not present

## 2021-12-18 DIAGNOSIS — K921 Melena: Secondary | ICD-10-CM

## 2021-12-18 DIAGNOSIS — K449 Diaphragmatic hernia without obstruction or gangrene: Secondary | ICD-10-CM | POA: Diagnosis present

## 2021-12-18 DIAGNOSIS — N179 Acute kidney failure, unspecified: Secondary | ICD-10-CM | POA: Diagnosis present

## 2021-12-18 DIAGNOSIS — R55 Syncope and collapse: Principal | ICD-10-CM | POA: Diagnosis present

## 2021-12-18 DIAGNOSIS — K5909 Other constipation: Secondary | ICD-10-CM | POA: Diagnosis not present

## 2021-12-18 DIAGNOSIS — K5641 Fecal impaction: Secondary | ICD-10-CM | POA: Diagnosis present

## 2021-12-18 DIAGNOSIS — R001 Bradycardia, unspecified: Secondary | ICD-10-CM | POA: Diagnosis present

## 2021-12-18 DIAGNOSIS — I452 Bifascicular block: Secondary | ICD-10-CM | POA: Diagnosis present

## 2021-12-18 DIAGNOSIS — Z1152 Encounter for screening for COVID-19: Secondary | ICD-10-CM | POA: Diagnosis not present

## 2021-12-18 DIAGNOSIS — M7989 Other specified soft tissue disorders: Secondary | ICD-10-CM | POA: Diagnosis not present

## 2021-12-18 DIAGNOSIS — K5790 Diverticulosis of intestine, part unspecified, without perforation or abscess without bleeding: Secondary | ICD-10-CM | POA: Diagnosis present

## 2021-12-18 DIAGNOSIS — T17908A Unspecified foreign body in respiratory tract, part unspecified causing other injury, initial encounter: Secondary | ICD-10-CM

## 2021-12-18 DIAGNOSIS — E876 Hypokalemia: Secondary | ICD-10-CM | POA: Diagnosis present

## 2021-12-18 DIAGNOSIS — R739 Hyperglycemia, unspecified: Secondary | ICD-10-CM

## 2021-12-18 DIAGNOSIS — R4182 Altered mental status, unspecified: Secondary | ICD-10-CM | POA: Diagnosis present

## 2021-12-18 DIAGNOSIS — I129 Hypertensive chronic kidney disease with stage 1 through stage 4 chronic kidney disease, or unspecified chronic kidney disease: Secondary | ICD-10-CM | POA: Diagnosis present

## 2021-12-18 DIAGNOSIS — H9190 Unspecified hearing loss, unspecified ear: Secondary | ICD-10-CM | POA: Diagnosis present

## 2021-12-18 DIAGNOSIS — I4510 Unspecified right bundle-branch block: Secondary | ICD-10-CM | POA: Diagnosis not present

## 2021-12-18 DIAGNOSIS — I440 Atrioventricular block, first degree: Secondary | ICD-10-CM

## 2021-12-18 DIAGNOSIS — N1832 Chronic kidney disease, stage 3b: Secondary | ICD-10-CM | POA: Diagnosis present

## 2021-12-18 DIAGNOSIS — Z79899 Other long term (current) drug therapy: Secondary | ICD-10-CM | POA: Diagnosis not present

## 2021-12-18 DIAGNOSIS — I959 Hypotension, unspecified: Secondary | ICD-10-CM

## 2021-12-18 DIAGNOSIS — I10 Essential (primary) hypertension: Secondary | ICD-10-CM

## 2021-12-18 LAB — URINALYSIS, ROUTINE W REFLEX MICROSCOPIC
Bilirubin Urine: NEGATIVE
Glucose, UA: NEGATIVE mg/dL
Hgb urine dipstick: NEGATIVE
Ketones, ur: NEGATIVE mg/dL
Leukocytes,Ua: NEGATIVE
Nitrite: NEGATIVE
Protein, ur: NEGATIVE mg/dL
Specific Gravity, Urine: 1.018 (ref 1.005–1.030)
pH: 5 (ref 5.0–8.0)

## 2021-12-18 LAB — LACTIC ACID, PLASMA
Lactic Acid, Venous: 1.7 mmol/L (ref 0.5–1.9)
Lactic Acid, Venous: 1.9 mmol/L (ref 0.5–1.9)

## 2021-12-18 LAB — CBC WITH DIFFERENTIAL/PLATELET
Abs Immature Granulocytes: 0 10*3/uL (ref 0.00–0.07)
Basophils Absolute: 0 10*3/uL (ref 0.0–0.1)
Basophils Relative: 0 %
Eosinophils Absolute: 0 10*3/uL (ref 0.0–0.5)
Eosinophils Relative: 0 %
HCT: 38.5 % (ref 36.0–46.0)
Hemoglobin: 13.4 g/dL (ref 12.0–15.0)
Lymphocytes Relative: 43 %
Lymphs Abs: 4.6 10*3/uL — ABNORMAL HIGH (ref 0.7–4.0)
MCH: 33.3 pg (ref 26.0–34.0)
MCHC: 34.8 g/dL (ref 30.0–36.0)
MCV: 95.5 fL (ref 80.0–100.0)
Monocytes Absolute: 0.4 10*3/uL (ref 0.1–1.0)
Monocytes Relative: 4 %
Neutro Abs: 5.7 10*3/uL (ref 1.7–7.7)
Neutrophils Relative %: 53 %
Platelets: 282 10*3/uL (ref 150–400)
RBC: 4.03 MIL/uL (ref 3.87–5.11)
RDW: 12.2 % (ref 11.5–15.5)
WBC: 10.7 10*3/uL — ABNORMAL HIGH (ref 4.0–10.5)
nRBC: 0 % (ref 0.0–0.2)
nRBC: 0 /100 WBC

## 2021-12-18 LAB — CBC
HCT: 41 % (ref 36.0–46.0)
Hemoglobin: 13.8 g/dL (ref 12.0–15.0)
MCH: 32.2 pg (ref 26.0–34.0)
MCHC: 33.7 g/dL (ref 30.0–36.0)
MCV: 95.6 fL (ref 80.0–100.0)
Platelets: 249 10*3/uL (ref 150–400)
RBC: 4.29 MIL/uL (ref 3.87–5.11)
RDW: 12.2 % (ref 11.5–15.5)
WBC: 15.3 10*3/uL — ABNORMAL HIGH (ref 4.0–10.5)
nRBC: 0 % (ref 0.0–0.2)

## 2021-12-18 LAB — ECHOCARDIOGRAM COMPLETE
AR max vel: 2.41 cm2
AV Area VTI: 2.52 cm2
AV Area mean vel: 2.41 cm2
AV Mean grad: 4 mmHg
AV Peak grad: 7 mmHg
Ao pk vel: 1.32 m/s
Area-P 1/2: 2.9 cm2
Height: 69 in
S' Lateral: 2.6 cm
Weight: 3238.12 oz

## 2021-12-18 LAB — HEMOGLOBIN AND HEMATOCRIT, BLOOD
HCT: 37.9 % (ref 36.0–46.0)
HCT: 36.9 % (ref 36.0–46.0)
Hemoglobin: 12.9 g/dL (ref 12.0–15.0)
Hemoglobin: 13 g/dL (ref 12.0–15.0)

## 2021-12-18 LAB — COMPREHENSIVE METABOLIC PANEL WITH GFR
ALT: 25 U/L (ref 0–44)
AST: 38 U/L (ref 15–41)
Albumin: 3.4 g/dL — ABNORMAL LOW (ref 3.5–5.0)
Alkaline Phosphatase: 71 U/L (ref 38–126)
Anion gap: 12 (ref 5–15)
BUN: 32 mg/dL — ABNORMAL HIGH (ref 6–20)
CO2: 23 mmol/L (ref 22–32)
Calcium: 9.4 mg/dL (ref 8.9–10.3)
Chloride: 105 mmol/L (ref 98–111)
Creatinine, Ser: 1.92 mg/dL — ABNORMAL HIGH (ref 0.44–1.00)
GFR, Estimated: 29 mL/min — ABNORMAL LOW
Glucose, Bld: 178 mg/dL — ABNORMAL HIGH (ref 70–99)
Potassium: 3.2 mmol/L — ABNORMAL LOW (ref 3.5–5.1)
Sodium: 140 mmol/L (ref 135–145)
Total Bilirubin: 0.4 mg/dL (ref 0.3–1.2)
Total Protein: 7 g/dL (ref 6.5–8.1)

## 2021-12-18 LAB — BASIC METABOLIC PANEL
Anion gap: 10 (ref 5–15)
BUN: 30 mg/dL — ABNORMAL HIGH (ref 6–20)
CO2: 25 mmol/L (ref 22–32)
Calcium: 9.4 mg/dL (ref 8.9–10.3)
Chloride: 105 mmol/L (ref 98–111)
Creatinine, Ser: 1.74 mg/dL — ABNORMAL HIGH (ref 0.44–1.00)
GFR, Estimated: 33 mL/min — ABNORMAL LOW (ref >60)
Glucose, Bld: 126 mg/dL — ABNORMAL HIGH (ref 70–99)
Potassium: 3.3 mmol/L — ABNORMAL LOW (ref 3.5–5.1)
Sodium: 140 mmol/L (ref 135–145)

## 2021-12-18 LAB — RAPID URINE DRUG SCREEN, HOSP PERFORMED
Amphetamines: NOT DETECTED
Barbiturates: NOT DETECTED
Benzodiazepines: NOT DETECTED
Cocaine: NOT DETECTED
Opiates: NOT DETECTED
Tetrahydrocannabinol: NOT DETECTED

## 2021-12-18 LAB — TROPONIN I (HIGH SENSITIVITY)
Troponin I (High Sensitivity): 7 ng/L (ref <18)
Troponin I (High Sensitivity): 6 ng/L
Troponin I (High Sensitivity): 6 ng/L (ref <18)

## 2021-12-18 LAB — RESP PANEL BY RT-PCR (FLU A&B, COVID) ARPGX2
Influenza A by PCR: NEGATIVE
Influenza B by PCR: NEGATIVE
SARS Coronavirus 2 by RT PCR: NEGATIVE

## 2021-12-18 LAB — HEMOGLOBIN A1C
Hgb A1c MFr Bld: 5.3 % (ref 4.8–5.6)
Mean Plasma Glucose: 105.41 mg/dL

## 2021-12-18 LAB — OCCULT BLOOD X 1 CARD TO LAB, STOOL: Fecal Occult Bld: POSITIVE — AB

## 2021-12-18 LAB — GLUCOSE, CAPILLARY: Glucose-Capillary: 147 mg/dL — ABNORMAL HIGH (ref 70–99)

## 2021-12-18 LAB — MAGNESIUM: Magnesium: 1.7 mg/dL (ref 1.7–2.4)

## 2021-12-18 LAB — D-DIMER, QUANTITATIVE
D-Dimer, Quant: >20 ug/mL-FEU — ABNORMAL HIGH (ref 0.00–0.50)
D-Dimer, Quant: 15.96 ug/mL-FEU — ABNORMAL HIGH (ref 0.00–0.50)

## 2021-12-18 LAB — ACETAMINOPHEN LEVEL: Acetaminophen (Tylenol), Serum: <10 ug/mL — ABNORMAL LOW (ref 10–30)

## 2021-12-18 LAB — ABO/RH: ABO/RH(D): B POS

## 2021-12-18 LAB — HIV ANTIBODY (ROUTINE TESTING W REFLEX): HIV Screen 4th Generation wRfx: NONREACTIVE

## 2021-12-18 LAB — TYPE AND SCREEN
ABO/RH(D): B POS
Antibody Screen: NEGATIVE

## 2021-12-18 LAB — PROCALCITONIN: Procalcitonin: 4.23 ng/mL

## 2021-12-18 LAB — SALICYLATE LEVEL: Salicylate Lvl: <7 mg/dL — ABNORMAL LOW (ref 7.0–30.0)

## 2021-12-18 LAB — TSH: TSH: 1.249 u[IU]/mL (ref 0.350–4.500)

## 2021-12-18 MED ORDER — BISACODYL 5 MG PO TBEC: 5.0000 mg | DELAYED_RELEASE_TABLET | Freq: Every day | ORAL | Status: DC | PRN

## 2021-12-18 MED ORDER — LORATADINE 10 MG PO TABS
10.0000 mg | ORAL_TABLET | Freq: Once | ORAL | Status: AC
  Administered 2021-12-18: 10 mg via ORAL
  Filled 2021-12-18: qty 1

## 2021-12-18 MED ORDER — ENOXAPARIN SODIUM 40 MG/0.4ML IJ SOSY: 40.0000 mg | PREFILLED_SYRINGE | INTRAMUSCULAR | Status: DC

## 2021-12-18 MED ORDER — ACETAMINOPHEN 650 MG RE SUPP: 650.0000 mg | Freq: Four times a day (QID) | RECTAL | Status: DC | PRN

## 2021-12-18 MED ORDER — ACETAMINOPHEN 325 MG PO TABS: 650.0000 mg | ORAL_TABLET | Freq: Four times a day (QID) | ORAL | Status: DC | PRN

## 2021-12-18 MED ORDER — HEPARIN (PORCINE) 25000 UT/250ML-% IV SOLN
1500.0000 [IU]/h | INTRAVENOUS | Status: DC
  Administered 2021-12-18: 1500 [IU]/h via INTRAVENOUS
  Filled 2021-12-18: qty 250

## 2021-12-18 MED ORDER — ONDANSETRON HCL 4 MG PO TABS: 4.0000 mg | ORAL_TABLET | Freq: Four times a day (QID) | ORAL | Status: DC | PRN

## 2021-12-18 MED ORDER — PERFLUTREN LIPID MICROSPHERE
1.0000 mL | INTRAVENOUS | Status: AC | PRN
  Administered 2021-12-18: 2 mL via INTRAVENOUS

## 2021-12-18 MED ORDER — SODIUM CHLORIDE 0.9 % IV SOLN: INTRAVENOUS | Status: DC

## 2021-12-18 MED ORDER — HEPARIN BOLUS VIA INFUSION
4000.0000 [IU] | Freq: Once | INTRAVENOUS | Status: AC
  Administered 2021-12-18: 4000 [IU] via INTRAVENOUS
  Filled 2021-12-18: qty 4000

## 2021-12-18 MED ORDER — POTASSIUM CHLORIDE CRYS ER 20 MEQ PO TBCR
20.0000 meq | EXTENDED_RELEASE_TABLET | Freq: Once | ORAL | Status: AC
  Administered 2021-12-18: 20 meq via ORAL
  Filled 2021-12-18: qty 1

## 2021-12-18 MED ORDER — DOCUSATE SODIUM 100 MG PO CAPS
100.0000 mg | ORAL_CAPSULE | Freq: Two times a day (BID) | ORAL | Status: DC
  Administered 2021-12-18 – 2021-12-20 (×5): 100 mg via ORAL
  Filled 2021-12-18 (×5): qty 1

## 2021-12-18 MED ORDER — ONDANSETRON HCL 4 MG/2ML IJ SOLN: 4.0000 mg | Freq: Four times a day (QID) | INTRAMUSCULAR | Status: DC | PRN

## 2021-12-18 MED ORDER — POTASSIUM CHLORIDE CRYS ER 20 MEQ PO TBCR
40.0000 meq | EXTENDED_RELEASE_TABLET | Freq: Once | ORAL | Status: AC
  Administered 2021-12-18: 40 meq via ORAL
  Filled 2021-12-18: qty 2

## 2021-12-18 MED ORDER — SENNA 8.6 MG PO TABS
1.0000 | ORAL_TABLET | Freq: Two times a day (BID) | ORAL | Status: DC
  Administered 2021-12-18 – 2021-12-20 (×5): 8.6 mg via ORAL
  Filled 2021-12-18 (×5): qty 1

## 2021-12-18 MED ORDER — SODIUM CHLORIDE 0.9% FLUSH
3.0000 mL | Freq: Two times a day (BID) | INTRAVENOUS | Status: DC
  Administered 2021-12-18 – 2021-12-20 (×5): 3 mL via INTRAVENOUS

## 2021-12-18 MED ORDER — FAMOTIDINE IN NACL 20-0.9 MG/50ML-% IV SOLN
20.0000 mg | Freq: Once | INTRAVENOUS | Status: AC
  Administered 2021-12-18: 20 mg via INTRAVENOUS
  Filled 2021-12-18: qty 50

## 2021-12-18 MED ORDER — TECHNETIUM TO 99M ALBUMIN AGGREGATED
4.0000 | Freq: Once | INTRAVENOUS | Status: AC | PRN
  Administered 2021-12-18: 4 via INTRAVENOUS

## 2021-12-18 NOTE — Progress Notes (Signed)
VASCULAR LAB    Bilateral lower extremity venous duplex has been performed.  See CV proc for preliminary results.   Robie Mcniel, RVT 12/18/2021, 1:24 PM

## 2021-12-18 NOTE — ED Notes (Signed)
Patient ambulated to bathroom, and tolerated it well. Pt collected a urine sample and had a BM. This NT sent the urine sample to lab. RN notified.

## 2021-12-18 NOTE — Procedures (Signed)
Patient Name: Christine Salazar  MRN: [id number]  Epilepsy Attending: Lora Havens  Referring Physician/Provider: Antonieta Pert, MD Date: 12/18/2021 Duration: 26.04 mins  Patient history: 61yo F presents to ED following syncopal episode and unresponsiveness at home while trying to use restroom.  Level of alertness: Awake, asleep  AEDs during EEG study: None  Technical aspects: This EEG study was done with scalp electrodes positioned according to the 10-20 International system of electrode placement. Electrical activity was reviewed with band pass filter of 1-'70Hz'$ , sensitivity of 7 uV/mm, display speed of 6m/sec with a '60Hz'$  notched filter applied as appropriate. EEG data were recorded continuously and digitally stored.  Video monitoring was available and reviewed as appropriate.  Description: No clear posterior dominant rhythm consists of 9-10 Hz activity of moderate voltage (25-35 uV) seen predominantly in posterior head regions, symmetric and reactive to eye opening and eye closing. Sleep was characterized by vertex waves, sleep spindles (12 to 14 Hz), maximal frontocentral region.  There is low amplitude 15 to 18 Hz beta activity distributed symmetrically and diffusely.  Physiologic photic driving was not seen during photic stimulation.  Hyperventilation was not performed.     IMPRESSION: This study is within normal limits. No seizures or epileptiform discharges were seen throughout the recording.  A normal interictal EEG does not exclude the diagnosis of epilepsy.  Christine Salazar Christine Salazar

## 2021-12-18 NOTE — Progress Notes (Signed)
Patient D.Dimer has come back at >20.00  Will order VQ scan given creat.  Will order empiric heparin gtt for the moment.

## 2021-12-18 NOTE — H&P (Signed)
History and Physical    Patient: Christine Salazar QPR:[id number] DOB: 06-Jan-1961 DOA: 12/17/2021 DOS: the patient was seen and examined on 12/18/2021 PCP: Lucianne Lei, MD  Patient coming from: Home  Chief Complaint:  Chief Complaint  Patient presents with   Loss of Consciousness   HPI: Christine Salazar is a 61 y.o. female with medical history significant of hearing loss, HTN.  Pt presents to ED following syncopal episode and unresponsiveness at home while trying to use restroom.  Initially hypotensive with EMS, BP 64/32.  Vomit present, had to be suctioned.  Initially requiring NRB in ED she has been weaned down and is now satting in upper 90s on RA.  IVF boluses given.  BP now normalized.  Prolonged duration of AMS following event she has now recovered mentation.  Also having hives post vancomycin.  Review of Systems: As mentioned in the history of present illness. All other systems reviewed and are negative. Past Medical History:  Diagnosis Date   Hearing loss    Hypertension    Morton's neuroma of left foot    Past Surgical History:  Procedure Laterality Date   NEURECTOMY FOOT     Social History:  reports that she has never smoked. She has never used smokeless tobacco. No history on file for alcohol use and drug use.  Allergies  Allergen Reactions   Vancomycin Hives    Arms and legs, seems to be true allergy and not just red-man syndrome.    Family History  Problem Relation Age of Onset   Hearing loss Neg Hx     Prior to Admission medications   Medication Sig Start Date End Date Taking? Authorizing Provider  amLODipine (NORVASC) 5 MG tablet Take 5 mg by mouth daily.  05/31/13  Yes [provider]  carvedilol (COREG) 12.5 MG tablet Take 12.5 mg by mouth 2 (two) times daily. 04/03/19  Yes [provider]  spironolactone-hydrochlorothiazide (ALDACTAZIDE) 25-25 MG tablet Take 1 tablet by mouth every morning. 03/26/19  Yes [provider]    Physical Exam: Vitals:   12/18/21 0230 12/18/21 0245 12/18/21 0253 12/18/21 0301  BP: 123/76 117/77  127/89  Pulse: 69 63  77  Resp: '19 19  19  '$ Temp:   (!) 96.9 F (36.1 C)   TempSrc:   Axillary   SpO2: 99% 100%  100%   Constitutional: NAD, calm, comfortable Eyes: PERRL, lids and conjunctivae normal ENMT: Mucous membranes are moist. Posterior pharynx clear of any exudate or lesions.Normal dentition.  Neck: normal, supple, no masses, no thyromegaly Respiratory: Rales present Cardiovascular: Regular rate and rhythm, no murmurs / rubs / gallops. No extremity edema. 2+ pedal pulses. No carotid bruits.  Abdomen: no tenderness, no masses palpated. No hepatosplenomegaly. Bowel sounds positive.  Musculoskeletal: no clubbing / cyanosis. No joint deformity upper and lower extremities. Good ROM, no contractures. Normal muscle tone.  Skin: no rashes, lesions, ulcers. No induration Neurologic: CN 2-12 grossly intact. Sensation intact, DTR normal. Strength 5/5 in all 4.  Psychiatric: Normal judgment and insight. Alert and oriented x 3. Normal mood.   Data Reviewed:        Latest Ref Rng & Units 12/17/2021   11:04 PM 04/10/2019    5:40 PM  CMP  Glucose 70 - 99 mg/dL 178  115   BUN 6 - 20 mg/dL 32  27   Creatinine 0.44 - 1.00 mg/dL 1.92  1.44   Sodium 135 - 145 mmol/L 140  139   Potassium  3.5 - 5.1 mmol/L 3.2  4.2   Chloride 98 - 111 mmol/L 105  102   CO2 22 - 32 mmol/L 23  26   Calcium 8.9 - 10.3 mg/dL 9.4  10.2   Total Protein 6.5 - 8.1 g/dL 7.0  8.9   Total Bilirubin 0.3 - 1.2 mg/dL 0.4  0.5   Alkaline Phos 38 - 126 U/L 71  92   AST 15 - 41 U/L 38  27   ALT 0 - 44 U/L 25  33    CBC    Component Value Date/Time   WBC 10.7 (H) 12/17/2021 2304   RBC 4.03 12/17/2021 2304   HGB 13.4 12/17/2021 2304   HCT 38.5 12/17/2021 2304   PLT 282 12/17/2021 2304   MCV 95.5 12/17/2021 2304   MCH 33.3 12/17/2021 2304   MCHC 34.8 12/17/2021 2304   RDW 12.2 12/17/2021 2304    LYMPHSABS 4.6 (H) 12/17/2021 2304   MONOABS 0.4 12/17/2021 2304   EOSABS 0.0 12/17/2021 2304   BASOSABS 0.0 12/17/2021 2304    UA pending  CXR neg  Trop neg x1  Lactate nl x2  CT AP: IMPRESSION: 1. Small hiatal hernia. 2. Colonic diverticulosis. 3. Large stool burden with impacted stool noted within the distal sigmoid colon and rectum.  EKG: Chronic RBBB, prolonged PR interval, 1st EKG shows sinus rhythm, second EKG shows Bigeminy question Mobitz 2?  Question non-conducted P waves?  Assessment and Plan: * Syncope High risk syncope / cardiogenic syncope suspected given abnormal initial EKGs in ED. Syncope pathway Tele monitor Hold BB, avoid nodal blocking agents 2d echo Check repeat trop (first neg) Check d.dimer, though no tachycardia, PE seems a bit less likely  AKI (acute kidney injury) ([place]) AKI vs undiagnosed CKD, unclear what her baseline is. If AKI, possibly pre-renal from initial hypotension? Got 2L IVF in ED Repeat BMP in AM Intake and output. Hold home BP meds including diuretics. UA pending  Aspiration into airway Emesis with concern for aspiration clinically. Got sepsis ABx in ED. Will hold off on further ABx orders for the moment given no SIRS, nl lactate, CXR neg, and no longer has any O2 requirement. Check procalcitonin. At risk for developing aspiration PNA in next couple of days.  Hyperglycemia Mild hyperglycemia, no h/o DM. Check A1C Repeat BMP in AM  HTN (hypertension) Hold home BP meds given initial hypotension at home.  Constipation, chronic Apparently her acute constipation seen on CT is already resolved / resolving with 2 very large BMs in the past 1 hour thus far per ED RN. Add dulcolax PRN and scheduled stool softener.      Advance Care Planning:   Code Status: Full Code  Consults: None  Family Communication: Family at bedside  Severity of Illness: The appropriate patient status for this patient is OBSERVATION.  Observation status is judged to be reasonable and necessary in order to provide the required intensity of service to ensure the patient's safety. The patient's presenting symptoms, physical exam findings, and initial radiographic and laboratory data in the context of their medical condition is felt to place them at decreased risk for further clinical deterioration. Furthermore, it is anticipated that the patient will be medically stable for discharge from the hospital within 2 midnights of admission.   Author: Etta Quill., DO 12/18/2021 3:11 AM  For on call review [email or web address].

## 2021-12-18 NOTE — Assessment & Plan Note (Signed)
Mild hyperglycemia, no h/o DM. 1. Check A1C 2. Repeat BMP in AM

## 2021-12-18 NOTE — Progress Notes (Signed)
Patient seen and examined personally, I reviewed the chart, history and physical and admission note, done by admitting physician this morning and agree with the same with following addendum.  Please refer to the morning admission note for more detailed plan of care.  Briefly, 61 year old female with hearing loss, hypertension presented with syncope and unresponsiveness at home while trying to use restroom in the ED initially hypotensive 60/32, vomited needed suctioning initially needing nonrebreather subsequently saturating in 90s on room air Work-up in the ED showed elevated creatinine, hypokalemia, leukocytosis.  Chest x-ray CT head unremarkable, CT renal stone with large stool burden with impacted stool in the distal sigmoid colon and rectum.  Serial troponins -6>7>6, normal lactic acid.  UA unremarkable COVID-19 influenza TSH A1c normal, on treatable Tylenol salicylate level.   Patient admitted for syncope AKI (previous baseline creatinine 1.4 04/10/2019 ~CKD 3a), aspiration, leukocytosis hyperglycemia. issues Syncope Elevated D-dimer AKI on CKD3a:(previous baseline creatinine 1.4 on 04/10/2019 ~CKD 3a.  Leukocytosis/aspiration into airway Hyperglycemia Hypertension Hypokalemia Constipation/fecal impaction:had multiple BM  She is Is alert awake resting well. No complaints sisters at bedside  D-dimer came back extremely elevated >20;VQ scan was ordered along with empiric IV heparin gtt, echocardiogram ( came back fine), duplex ( reportedly negative per sisters at bedside, report pending). VQ scan-that came back negative, heparin discontinued as patient not hypoxic not tachycardia chest pain- f/u leg duplex, repeat D-dimer ordered. Cardiology consulted as well for syncope given abnormal EKG in the ED. Given prolonged AMS following syncope-ordered EEG Creat 1.9 on admit > to 1.7, continue gentle IV fluids Recheck orthostatics. RN reports smear of blood on wiping bottom and also 30 cc mucus mixed  blood-FOBT ordered +, she had bolus heparin in ED  and infusion was running briefly before we stopped after vq scan resulted. Asked Dr Carlean Purl to see her Initially received antibiotics but held further doses> procalcitonin ordered,blood culture sent UA unremarkable.  Has no respiratory complaints.sisters updated at bedside

## 2021-12-18 NOTE — Progress Notes (Signed)
EEG complete - results pending 

## 2021-12-18 NOTE — Assessment & Plan Note (Addendum)
Emesis with concern for aspiration clinically. 1. Got sepsis ABx in ED. 2. Will hold off on further ABx orders for the moment given no SIRS, nl lactate, CXR neg, and no longer has any O2 requirement. 3. Check procalcitonin. 4. At risk for developing aspiration PNA in next couple of days.

## 2021-12-18 NOTE — Plan of Care (Signed)
  Problem: Education: Goal: Knowledge of condition and prescribed therapy will improve 12/18/2021 1811 by Jerolyn Shin, RN Outcome: Progressing 12/18/2021 1811 by Jerolyn Shin, RN Outcome: Progressing   Problem: Cardiac: Goal: Will achieve and/or maintain adequate cardiac output 12/18/2021 1811 by Jerolyn Shin, RN Outcome: Progressing 12/18/2021 1811 by Jerolyn Shin, RN Outcome: Progressing   Problem: Physical Regulation: Goal: Complications related to the disease process, condition or treatment will be avoided or minimized 12/18/2021 1811 by Jerolyn Shin, RN Outcome: Progressing 12/18/2021 1811 by Jerolyn Shin, RN Outcome: Progressing

## 2021-12-18 NOTE — ED Provider Notes (Signed)
[facility] EMERGENCY DEPARTMENT Provider Note   CSN: [id number] Arrival date & time: 12/17/21  2247     History  Chief Complaint  Patient presents with   Loss of Consciousness    Christine Salazar is a 61 y.o. female.  The history is provided by the EMS personnel. The history is limited by the condition of the patient (AMS).  Loss of Consciousness Episode history:  Single Most recent episode:  Today Timing:  Constant Progression:  Resolved Chronicity:  New Context: not blood draw, not bowel movement, not dehydration and not exertion   Witnessed: no   Relieved by:  Nothing Worsened by:  Nothing Ineffective treatments:  None tried Associated symptoms: vomiting   Associated symptoms: no anxiety, no fever, no focal weakness, no recent surgery, no shortness of breath and no visual change   Risk factors: no seizures   Patient with HTN who had LOC with vomiting and hypotension 65 SBP per EMS.      Past Medical History:  Diagnosis Date   Hearing loss    Hypertension    Morton's neuroma of left foot     Home Medications Prior to Admission medications   Medication Sig Start Date End Date Taking? Authorizing Provider  amLODipine (NORVASC) 5 MG tablet Take 5 mg by mouth daily.  05/31/13  Yes [provider]  carvedilol (COREG) 12.5 MG tablet Take 12.5 mg by mouth 2 (two) times daily. 04/03/19  Yes [provider]  spironolactone-hydrochlorothiazide (ALDACTAZIDE) 25-25 MG tablet Take 1 tablet by mouth every morning. 03/26/19  Yes [provider]      Allergies    Patient has no known allergies.    Review of Systems   Review of Systems  Unable to perform ROS: Acuity of condition  Constitutional:  Negative for fever.  HENT:  Negative for facial swelling.   Eyes:  Negative for redness.  Respiratory:  Negative for shortness of breath.   Cardiovascular:  Positive for syncope.  Gastrointestinal:  Positive for vomiting.   Neurological:  Negative for focal weakness.    Physical Exam Updated Vital Signs BP 123/76   Pulse 69   Temp 97.9 F (36.6 C) (Oral)   Resp 19   SpO2 99%  Physical Exam Vitals and nursing note reviewed.  Constitutional:      General: She is not in acute distress.    Appearance: Normal appearance. She is well-developed.     Comments: Somnolent   HENT:     Head: Normocephalic and atraumatic.     Nose: Nose normal.  Eyes:     Pupils: Pupils are equal, round, and reactive to light.  Cardiovascular:     Rate and Rhythm: Normal rate and regular rhythm.     Pulses: Normal pulses.     Heart sounds: Normal heart sounds.  Pulmonary:     Effort: No respiratory distress.     Breath sounds: Rales present.  Abdominal:     General: Bowel sounds are normal. There is no distension.     Palpations: Abdomen is soft.     Tenderness: There is no abdominal tenderness. There is no guarding or rebound.  Genitourinary:    Vagina: No vaginal discharge.  Musculoskeletal:        General: Normal range of motion.     Cervical back: Normal range of motion and neck supple.  Skin:    General: Skin is warm and dry.     Capillary Refill: Capillary refill takes less  than 2 seconds.     Findings: No erythema or rash.  Neurological:     Mental Status: She is alert.     Deep Tendon Reflexes: Reflexes normal.     ED Results / Procedures / Treatments   Labs (all labs ordered are listed, but only abnormal results are displayed) Results for orders placed or performed during the hospital encounter of 12/17/21  Resp Panel by RT-PCR (Flu A&B, Covid) Anterior Nasal Swab   Specimen: Anterior Nasal Swab  Result Value Ref Range   SARS Coronavirus 2 by RT PCR NEGATIVE NEGATIVE   Influenza A by PCR NEGATIVE NEGATIVE   Influenza B by PCR NEGATIVE NEGATIVE  Lactic acid, plasma  Result Value Ref Range   Lactic Acid, Venous 1.7 0.5 - 1.9 mmol/L  Lactic acid, plasma  Result Value Ref Range   Lactic Acid,  Venous 1.9 0.5 - 1.9 mmol/L  Comprehensive metabolic panel  Result Value Ref Range   Sodium 140 135 - 145 mmol/L   Potassium 3.2 (L) 3.5 - 5.1 mmol/L   Chloride 105 98 - 111 mmol/L   CO2 23 22 - 32 mmol/L   Glucose, Bld 178 (H) 70 - 99 mg/dL   BUN 32 (H) 6 - 20 mg/dL   Creatinine, Ser 1.92 (H) 0.44 - 1.00 mg/dL   Calcium 9.4 8.9 - 10.3 mg/dL   Total Protein 7.0 6.5 - 8.1 g/dL   Albumin 3.4 (L) 3.5 - 5.0 g/dL   AST 38 15 - 41 U/L   ALT 25 0 - 44 U/L   Alkaline Phosphatase 71 38 - 126 U/L   Total Bilirubin 0.4 0.3 - 1.2 mg/dL   GFR, Estimated 29 (L) >60 mL/min   Anion gap 12 5 - 15  CBC with Differential  Result Value Ref Range   WBC 10.7 (H) 4.0 - 10.5 K/uL   RBC 4.03 3.87 - 5.11 MIL/uL   Hemoglobin 13.4 12.0 - 15.0 g/dL   HCT 38.5 36.0 - 46.0 %   MCV 95.5 80.0 - 100.0 fL   MCH 33.3 26.0 - 34.0 pg   MCHC 34.8 30.0 - 36.0 g/dL   RDW 12.2 11.5 - 15.5 %   Platelets 282 150 - 400 K/uL   nRBC 0.0 0.0 - 0.2 %   Neutrophils Relative % 53 %   Neutro Abs 5.7 1.7 - 7.7 K/uL   Lymphocytes Relative 43 %   Lymphs Abs 4.6 (H) 0.7 - 4.0 K/uL   Monocytes Relative 4 %   Monocytes Absolute 0.4 0.1 - 1.0 K/uL   Eosinophils Relative 0 %   Eosinophils Absolute 0.0 0.0 - 0.5 K/uL   Basophils Relative 0 %   Basophils Absolute 0.0 0.0 - 0.1 K/uL   nRBC 0 0 /100 WBC   Abs Immature Granulocytes 0.00 0.00 - 0.07 K/uL  Protime-INR  Result Value Ref Range   Prothrombin Time 13.9 11.4 - 15.2 seconds   INR 1.1 0.8 - 1.2  APTT  Result Value Ref Range   aPTT 21 (L) 24 - 36 seconds  Acetaminophen level  Result Value Ref Range   Acetaminophen (Tylenol), Serum <10 (L) 10 - 30 ug/mL  Salicylate level  Result Value Ref Range   Salicylate Lvl <3.2 (L) 7.0 - 30.0 mg/dL  CBC  Result Value Ref Range   WBC 15.3 (H) 4.0 - 10.5 K/uL   RBC 4.29 3.87 - 5.11 MIL/uL   Hemoglobin 13.8 12.0 - 15.0 g/dL   HCT 41.0 36.0 -  46.0 %   MCV 95.6 80.0 - 100.0 fL   MCH 32.2 26.0 - 34.0 pg   MCHC 33.7 30.0 - 36.0  g/dL   RDW 12.2 11.5 - 15.5 %   Platelets 249 150 - 400 K/uL   nRBC 0.0 0.0 - 0.2 %  CBG monitoring, ED  Result Value Ref Range   Glucose-Capillary 181 (H) 70 - 99 mg/dL  Troponin I (High Sensitivity)  Result Value Ref Range   Troponin I (High Sensitivity) 7 <18 ng/L  Troponin I (High Sensitivity)  Result Value Ref Range   Troponin I (High Sensitivity) 6 <18 ng/L   CT Renal Stone Study  Result Date: 12/18/2021 CLINICAL DATA:  Syncopal episode. EXAM: CT ABDOMEN AND PELVIS WITHOUT CONTRAST TECHNIQUE: Multidetector CT imaging of the abdomen and pelvis was performed following the standard protocol without IV contrast. RADIATION DOSE REDUCTION: This exam was performed according to the departmental dose-optimization program which includes automated exposure control, adjustment of the mA and/or kV according to patient size and/or use of iterative reconstruction technique. COMPARISON:  None Available. FINDINGS: Lower chest: Mild atelectasis is seen within the bilateral lung bases. Hepatobiliary: No focal liver abnormality is seen. No gallstones, gallbladder wall thickening, or biliary dilatation. Pancreas: Unremarkable. No pancreatic ductal dilatation or surrounding inflammatory changes. Spleen: Normal in size without focal abnormality. Adrenals/Urinary Tract: Adrenal glands are unremarkable. The kidneys are slightly small in size., without renal calculi, focal lesion, or hydronephrosis. Bladder is unremarkable. Stomach/Bowel: There is a small hiatal hernia. Appendix appears normal. Stool is seen throughout the large bowel. This is most prominent within the distal sigmoid colon and rectum. No evidence of bowel wall thickening, distention, or inflammatory changes. Noninflamed diverticula are seen throughout the ascending colon. Vascular/Lymphatic: No significant vascular findings are present. No enlarged abdominal or pelvic lymph nodes. Reproductive: Status post hysterectomy. No adnexal masses. Other: A 1.9  cm x 1.2 cm fat-containing umbilical hernia is noted. No abdominopelvic ascites. Musculoskeletal: Marked severity multilevel degenerative changes are seen throughout the lumbar spine. IMPRESSION: 1. Small hiatal hernia. 2. Colonic diverticulosis. 3. Large stool burden with impacted stool noted within the distal sigmoid colon and rectum. Electronically Signed   By: Virgina Norfolk M.D.   On: 12/18/2021 01:49   CT Head Wo Contrast  Result Date: 12/18/2021 CLINICAL DATA:  Syncopal episode EXAM: CT HEAD WITHOUT CONTRAST TECHNIQUE: Contiguous axial images were obtained from the base of the skull through the vertex without intravenous contrast. RADIATION DOSE REDUCTION: This exam was performed according to the departmental dose-optimization program which includes automated exposure control, adjustment of the mA and/or kV according to patient size and/or use of iterative reconstruction technique. COMPARISON:  04/10/2019 FINDINGS: Brain: No evidence of acute infarction, hemorrhage, mass, mass effect, or midline shift. No hydrocephalus or extra-axial fluid collection. Vascular: No hyperdense vessel. Skull: Normal. Negative for fracture or focal lesion. Sinuses/Orbits: No acute finding. Other: The mastoid air cells are well aerated. IMPRESSION: No acute intracranial process. Electronically Signed   By: Merilyn Baba M.D.   On: 12/18/2021 01:34   DG Chest Port 1 View  Result Date: 12/18/2021 CLINICAL DATA:  Possible sepsis.  Loss of consciousness. EXAM: PORTABLE CHEST 1 VIEW COMPARISON:  04/10/2019. FINDINGS: The heart size and mediastinal contours are stable. Lung volumes are low. No consolidation, effusion, or pneumothorax. No acute osseous abnormality. IMPRESSION: No active disease. Electronically Signed   By: Brett Fairy M.D.   On: 12/18/2021 00:16    EKG  EKG Interpretation  Date/Time:  Friday December 17 2021 23:10:11 EDT Ventricular Rate:  81 PR Interval:  312 QRS Duration: 174 QT  Interval:  485 QTC Calculation: 438 R Axis:   58 Text Interpretation: Sinus bradycardia Paired ventricular premature complexes Prolonged PR interval Right bundle branch block Confirmed by Randal Buba, Kriss Ishler ([id number]) on 12/18/2021 4:10:07 AM         Radiology CT Renal Stone Study  Result Date: 12/18/2021 CLINICAL DATA:  Syncopal episode. EXAM: CT ABDOMEN AND PELVIS WITHOUT CONTRAST TECHNIQUE: Multidetector CT imaging of the abdomen and pelvis was performed following the standard protocol without IV contrast. RADIATION DOSE REDUCTION: This exam was performed according to the departmental dose-optimization program which includes automated exposure control, adjustment of the mA and/or kV according to patient size and/or use of iterative reconstruction technique. COMPARISON:  None Available. FINDINGS: Lower chest: Mild atelectasis is seen within the bilateral lung bases. Hepatobiliary: No focal liver abnormality is seen. No gallstones, gallbladder wall thickening, or biliary dilatation. Pancreas: Unremarkable. No pancreatic ductal dilatation or surrounding inflammatory changes. Spleen: Normal in size without focal abnormality. Adrenals/Urinary Tract: Adrenal glands are unremarkable. The kidneys are slightly small in size., without renal calculi, focal lesion, or hydronephrosis. Bladder is unremarkable. Stomach/Bowel: There is a small hiatal hernia. Appendix appears normal. Stool is seen throughout the large bowel. This is most prominent within the distal sigmoid colon and rectum. No evidence of bowel wall thickening, distention, or inflammatory changes. Noninflamed diverticula are seen throughout the ascending colon. Vascular/Lymphatic: No significant vascular findings are present. No enlarged abdominal or pelvic lymph nodes. Reproductive: Status post hysterectomy. No adnexal masses. Other: A 1.9 cm x 1.2 cm fat-containing umbilical hernia is noted. No abdominopelvic ascites. Musculoskeletal: Marked severity  multilevel degenerative changes are seen throughout the lumbar spine. IMPRESSION: 1. Small hiatal hernia. 2. Colonic diverticulosis. 3. Large stool burden with impacted stool noted within the distal sigmoid colon and rectum. Electronically Signed   By: Virgina Norfolk M.D.   On: 12/18/2021 01:49   CT Head Wo Contrast  Result Date: 12/18/2021 CLINICAL DATA:  Syncopal episode EXAM: CT HEAD WITHOUT CONTRAST TECHNIQUE: Contiguous axial images were obtained from the base of the skull through the vertex without intravenous contrast. RADIATION DOSE REDUCTION: This exam was performed according to the departmental dose-optimization program which includes automated exposure control, adjustment of the mA and/or kV according to patient size and/or use of iterative reconstruction technique. COMPARISON:  04/10/2019 FINDINGS: Brain: No evidence of acute infarction, hemorrhage, mass, mass effect, or midline shift. No hydrocephalus or extra-axial fluid collection. Vascular: No hyperdense vessel. Skull: Normal. Negative for fracture or focal lesion. Sinuses/Orbits: No acute finding. Other: The mastoid air cells are well aerated. IMPRESSION: No acute intracranial process. Electronically Signed   By: Merilyn Baba M.D.   On: 12/18/2021 01:34   DG Chest Port 1 View  Result Date: 12/18/2021 CLINICAL DATA:  Possible sepsis.  Loss of consciousness. EXAM: PORTABLE CHEST 1 VIEW COMPARISON:  04/10/2019. FINDINGS: The heart size and mediastinal contours are stable. Lung volumes are low. No consolidation, effusion, or pneumothorax. No acute osseous abnormality. IMPRESSION: No active disease. Electronically Signed   By: Brett Fairy M.D.   On: 12/18/2021 00:16    Procedures Procedures    Medications Ordered in ED Medications  lactated ringers infusion (has no administration in time range)  lactated ringers bolus 1,000 mL (0 mLs Intravenous Stopped 12/18/21 0128)    And  lactated ringers bolus 1,000 mL (0 mLs Intravenous  Hold 12/18/21 0014)    And  lactated ringers bolus 250 mL (0 mLs Intravenous Hold 12/18/21 0014)  famotidine (PEPCID) IVPB 20 mg premix (20 mg Intravenous New Bag/Given 12/18/21 0214)  ondansetron (ZOFRAN) injection 4 mg (4 mg Intravenous Given 12/17/21 2308)  naloxone [facility]) injection 2 mg (2 mg Intravenous Given 12/17/21 2308)  ceFEPIme (MAXIPIME) 2 g in sodium chloride 0.9 % 100 mL IVPB (0 g Intravenous Stopped 12/18/21 0039)  metroNIDAZOLE (FLAGYL) IVPB 500 mg (0 mg Intravenous Stopped 12/18/21 0128)  vancomycin (VANCOREADY) IVPB 1750 mg/350 mL (0 mg Intravenous Stopped 12/18/21 0214)  loratadine (CLARITIN) tablet 10 mg (10 mg Oral Given 12/18/21 0214)    ED Course/ Medical Decision Making/ A&P                           Medical Decision Making Patient with LOC at home with SBP 65 and vomiting   Amount and/or Complexity of Data Reviewed Independent Historian: EMS    Details: See above  External Data Reviewed: notes.    Details: ENT notes reviewed  Labs: ordered.    Details: All labs reviewed:  Urine consistent with UTI.  Covid negative. Negative tylenol and salicylate levels.  Blood cultures sent.   Troponins normal 6/7.  White count elevated 10.7, hemoglobin normal 13.4, normal platelet count. Lactate normal 1.7 and 1.9.  normal sodium 140, potassium low 3.2, elevated creatinine 1.92 elevated glucose 178, normal LFTs Radiology: ordered.    Details: Heat CT negative by me.  CT renal with constipation, no stones  ECG/medicine tests: ordered and independent interpretation performed. Decision-making details documented in ED Course. Discussion of management or test interpretation with external provider(s): Case d/w Dr. Kalman Shan of cardiology, not [facility].    Risk OTC drugs. Prescription drug management. Decision regarding hospitalization.  Critical Care Total time providing critical care: 60 minutes (Sepsis bundle consults triad and cardiology and complexity of diagnosis and  treatment.  )  CRITICAL CARE Performed by: Mustaf Antonacci K Elecia Serafin-Rasch Total critical care time: 60 minutes Critical care time was exclusive of separately billable procedures and treating other patients. Critical care was necessary to treat or prevent imminent or life-threatening deterioration. Critical care was time spent personally by me on the following activities: development of treatment plan with patient and/or surrogate as well as nursing, discussions with consultants, evaluation of patient's response to treatment, examination of patient, obtaining history from patient or surrogate, ordering and performing treatments and interventions, ordering and review of laboratory studies, ordering and review of radiographic studies, pulse oximetry and re-evaluation of patient's condition.  Final Clinical Impression(s) / ED Diagnoses Final diagnoses:  Syncope and collapse  Hypotension, unspecified hypotension type  First degree AV block    The patient appears reasonably stabilized for admission considering the current resources, flow, and capabilities available in the ED at this time, and I doubt any other [facility] requiring further screening and/or treatment in the ED prior to admission.    Nira Visscher, MD 12/18/21 [phone]

## 2021-12-18 NOTE — Assessment & Plan Note (Signed)
High risk syncope / cardiogenic syncope suspected given abnormal initial EKGs in ED. 1. Syncope pathway 2. Tele monitor 3. Hold BB, avoid nodal blocking agents 4. 2d echo 5. Check repeat trop (first neg) 6. Check d.dimer, though no tachycardia, PE seems a bit less likely

## 2021-12-18 NOTE — ED Notes (Signed)
Pt with poor vascular access - unable to obtain red top for second set of blood cultures.

## 2021-12-18 NOTE — Progress Notes (Signed)
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    Patient Measurements: Height: '5\' 9"'$  (175.3 cm) Weight: 91.8 kg (202 lb 6.1 oz) IBW/kg (Calculated) : 66.2 Heparin Dosing Weight: 85  Vital Signs: Temp: 97.6 F (36.4 C) (10/28 0648) Temp Source: Oral (10/28 0648) BP: 125/83 (10/28 0648) Pulse Rate: 63 (10/28 0648)  Labs: Recent Labs    12/17/21 2304 12/18/21 0210 12/18/21 0348  HGB 13.4  --  13.8  HCT 38.5  --  41.0  PLT 282  --  249  APTT 21*  --   --   LABPROT 13.9  --   --   INR 1.1  --   --   CREATININE 1.92*  --  1.74*  TROPONINIHS '7 6 6    '$ Estimated Creatinine Clearance: 41.5 mL/min (A) (by C-G formula based on SCr of 1.74 mg/dL (H)).   Medical History: Past Medical History:  Diagnosis Date   Hearing loss    Hypertension    Morton's neuroma of left foot     Medications:  Medications Prior to Admission  Medication Sig Dispense Refill Last Dose   amLODipine (NORVASC) 5 MG tablet Take 5 mg by mouth daily.    12/17/2021 at 090   carvedilol (COREG) 12.5 MG tablet Take 12.5 mg by mouth 2 (two) times daily.   12/17/2021 at 0930   spironolactone-hydrochlorothiazide (ALDACTAZIDE) 25-25 MG tablet Take 1 tablet by mouth every morning.   12/17/2021 at 0930   Scheduled:   docusate sodium  100 mg Oral BID   potassium chloride  20 mEq Oral Once   senna  1 tablet Oral BID   sodium chloride flush  3 mL Intravenous Q12H    Assessment: 61yo female c/o syncopal event, found by EMS to be hypotensive and hypoxic, initially treated with ABX and now D-dimer >20, to start heparin for suspected PE while awaiting VQ scan.  Goal of Therapy:  Heparin level 0.3-0.7 units/ml Monitor platelets by anticoagulation protocol: Yes   Plan:  Heparin 4000 units IV bolus x1 followed by infusion at 1500  units/hr. Monitor heparin levels and CBC.  Wynona Neat, PharmD, BCPS  12/18/2021,7:28 AM

## 2021-12-18 NOTE — Consult Note (Signed)
Cardiology Consultation:   Patient ID: Christine Salazar; [id number]; 1960-10-18   Admit date: 12/17/2021 Date of Consult: 12/18/2021  Primary Care Provider: Lucianne Lei, MD Primary Cardiologist: None  Primary Electrophysiologist:  None   Patient Profile:   Christine Salazar is a 61 y.o. female with a hx of hypertension who is being seen today for the evaluation of syncope at the request of Dr. Alcario Salazar.  History of Present Illness:   Christine Salazar has no significant prior cardiac history.  She has a history of hypertension.  She had been on beta-blockers among other meds as listed below on admission.  She was admitted after a syncopal episode.  When EMS arrived she was noted to be hypotensive.  There was vomit that had to be suctioned.  She was initially hypoxemic and on a nonrebreather.   She did have markedly elevated D-dimer but ventilation/perfusion study was negative for evidence of pulmonary embolism.  CT of the chest head demonstrated no acute neurologic process.  She was noted to have acute kidney injury with elevated creatinine.  CT of the abdomen demonstrated no renal stones.  She was acutely constipated which was noted on CT.  She actually had 2 large bowel movements today.  She was hydrated on presentation.  Blood pressures have improved.  Her antihypertensives overnight have been held.  She was mildly hypokalemic.  She does have a mildly elevated white blood cell count.  Her mother just died a couple of days ago in hospice.  The patient lives with her brother.  She had been living with her mom and brother.  Her sisters were in the home with her along with her brother when the event happened.  They heard her collapse in the bathroom.  The patient remembers going to the bathroom.  She has constipation as described above but she was not particularly in distress.  She apparently urinated then stood up and had frank syncope.  She remembers getting lightheaded.  Christine Salazar states she  hit her head on the shower.  She was probably out for about a minute before she started coming to a little bit.  They said she was having some abdominal pain.  It was not until she was in the emergency is quad that apparently she had vomiting and some aspiration but I do not know the details of this.  The patient otherwise is quite active.  She does a lot of yard work.  The patient denies any new symptoms such as chest discomfort, neck or arm discomfort. There has been no new shortness of breath, PND or orthopnea. There have been no reported palpitations, presyncope or syncope.    Echo today was unremarkable.  EF was normal.  No significant valvular abnormalities.  Lower extremity Doppler was done today to rule out DVT with results pending.  EKG on admission and as on most distant EKG 2021 demonstrated RBBB with first degree AV block.  The patient was unaware this tele overnight demonstrates sinus rhythm with sinus bradycardia with first-degree AV block.  There is winky block.  There may be a very brief episode of high degree heart block.  No PVCs with occasional bigeminy and couplets.  She is incidentally noted positive fecal occult blood but she was not initially anemic.  She had some red blood on a pad when she urinated today but there was no blood in the toilet.  Of note her stool was described by her sister who is a Marine scientist of 40 years  as being frankly bloody.  Past Medical History:  Diagnosis Date   Hearing loss    Hypertension    Morton's neuroma of left foot     Past Surgical History:  Procedure Laterality Date   NEURECTOMY FOOT       Home Medications:  Prior to Admission medications   Medication Sig Start Date End Date Taking? Authorizing Provider  amLODipine (NORVASC) 5 MG tablet Take 5 mg by mouth daily.  05/31/13  Yes [provider]  carvedilol (COREG) 12.5 MG tablet Take 12.5 mg by mouth 2 (two) times daily. 04/03/19  Yes [provider]   spironolactone-hydrochlorothiazide (ALDACTAZIDE) 25-25 MG tablet Take 1 tablet by mouth every morning. 03/26/19  Yes [provider]    Inpatient Medications: Scheduled Meds:  docusate sodium  100 mg Oral BID   senna  1 tablet Oral BID   sodium chloride flush  3 mL Intravenous Q12H   Continuous Infusions:  sodium chloride     heparin Stopped (12/18/21 1147)   PRN Meds: acetaminophen **OR** acetaminophen, bisacodyl, ondansetron **OR** ondansetron (ZOFRAN) IV  Allergies:    Allergies  Allergen Reactions   Vancomycin Hives    Arms and legs, seems to be true allergy and not just red-man syndrome.    Social History:   Social History   Socioeconomic History   Marital status: Married    Spouse name: Not on file   Number of children: Not on file   Years of education: Not on file   Highest education level: Not on file  Occupational History   Not on file  Tobacco Use   Smoking status: Never   Smokeless tobacco: Never  Substance and Sexual Activity   Alcohol use: Not on file   Drug use: Not on file   Sexual activity: Not on file  Other Topics Concern   Not on file  Social History Narrative   Not on file   Social Determinants of Health   Financial Resource Strain: Not on file  Food Insecurity: Not on file  Transportation Needs: Not on file  Physical Activity: Not on file  Stress: Not on file  Social Connections: Not on file  Intimate Partner Violence: Not on file    Family History:    Family History  Problem Relation Age of Onset   Hearing loss Neg Hx      ROS:  Please see the history of present illness.   All other ROS reviewed and negative.     Physical Exam/Data:   Vitals:   12/18/21 6720 12/18/21 0633 12/18/21 0648 12/18/21 0943  BP:   125/83 108/71  Pulse: 65 64 63 66  Resp: '18 19 18 18  '$ Temp:   97.6 F (36.4 C) 97.8 F (36.6 C)  TempSrc:   Oral   SpO2: 100% 100% 99% 98%  Weight:   91.8 kg   Height:   '5\' 9"'$  (1.753 m)      Intake/Output Summary (Last 24 hours) at 12/18/2021 1215 Last data filed at 12/18/2021 1151 Gross per 24 hour  Intake 2123.54 ml  Output --  Net 2123.54 ml   Filed Weights   12/18/21 0648  Weight: 91.8 kg   Body mass index is 29.89 kg/m.  GENERAL:  Well appearing HEENT:   Pupils equal round and reactive, fundi not visualized, oral mucosa unremarkable NECK:  No  jugular venous distention, waveform within normal limits, carotid upstroke brisk and symmetric, no bruits, no thyromegaly LYMPHATICS:  No cervical, inguinal adenopathy  LUNGS:   Clear to auscultation bilaterally BACK:  No CVA tenderness CHEST:   Unremarkable HEART:  PMI not displaced or sustained,S1 and S2 within normal limits, no S3, no S4, no clicks, no rubs,  murmurs ABD:  Flat, positive bowel sounds normal in frequency in pitch, no bruits, no rebound, no guarding, no midline pulsatile mass, no hepatomegaly, no splenomegaly EXT:  2 plus pulses throughout, no  edema, no cyanosis no clubbing SKIN:  No rashes no nodules NEURO:   Cranial nerves II through XII grossly intact except for decreased hearing, motor grossly intact throughout PSYCH:    Cognitively intact, oriented to person place and time   EKG:  The EKG was personally reviewed and demonstrates: As above Telemetry:  Telemetry was personally reviewed and demonstrates: Sinus rhythm, right bundle branch block, first-degree AV block as above  Relevant CV Studies:  ECHO.  12/18/2021   1. Left ventricular ejection fraction, by estimation, is 55 to 60%. The  left ventricle has normal function. The left ventricle has no regional  wall motion abnormalities. There is moderate asymmetric left ventricular  hypertrophy of the septal segment.  Left ventricular diastolic parameters are indeterminate.   2. Right ventricular systolic function is normal. The right ventricular  size is normal. There is normal pulmonary artery systolic pressure. The  estimated right  ventricular systolic pressure is 33.2 mmHg.   3. The mitral valve is abnormal. Mild mitral valve regurgitation. No  evidence of mitral stenosis.   4. The aortic valve is tricuspid. Aortic valve regurgitation is not  visualized.   5. The inferior vena cava is normal in size with <50% respiratory  variability, suggesting right atrial pressure of 8 mmHg.   Laboratory Data:  Chemistry Recent Labs  Lab 12/17/21 2304 12/18/21 0348  NA 140 140  K 3.2* 3.3*  CL 105 105  CO2 23 25  GLUCOSE 178* 126*  BUN 32* 30*  CREATININE 1.92* 1.74*  CALCIUM 9.4 9.4  GFRNONAA 29* 33*  ANIONGAP 12 10    Recent Labs  Lab 12/17/21 2304  PROT 7.0  ALBUMIN 3.4*  AST 38  ALT 25  ALKPHOS 71  BILITOT 0.4   Hematology Recent Labs  Lab 12/17/21 2304 12/18/21 0348  WBC 10.7* 15.3*  RBC 4.03 4.29  HGB 13.4 13.8  HCT 38.5 41.0  MCV 95.5 95.6  MCH 33.3 32.2  MCHC 34.8 33.7  RDW 12.2 12.2  PLT 282 249   Cardiac EnzymesNo results for input(s): "TROPONINI" in the last 168 hours. No results for input(s): "TROPIPOC" in the last 168 hours.  BNPNo results for input(s): "BNP", "PROBNP" in the last 168 hours.  DDimer  Recent Labs  Lab 12/18/21 0348  DDIMER >20.00*    Radiology/Studies:  NM Pulmonary Perfusion  Result Date: 12/18/2021 CLINICAL DATA:  Shortness of breath.  Positive D-dimer. EXAM: NUCLEAR MEDICINE PERFUSION LUNG SCAN TECHNIQUE: Perfusion images were obtained in multiple projections after intravenous injection of radiopharmaceutical. Ventilation scans intentionally deferred if perfusion scan and chest x-ray adequate for interpretation during COVID 19 epidemic. RADIOPHARMACEUTICALS:  For mCi Tc-2mMAA IV COMPARISON:  Chest or x-ray earlier same day FINDINGS: No segmental peripheral perfusion defect in either lung. IMPRESSION: Negative for pulmonary embolus. Electronically Signed   By: EMisty StanleyM.D.   On: 12/18/2021 11:34   CT Renal Stone Study  Result Date:  12/18/2021 CLINICAL DATA:  Syncopal episode. EXAM: CT ABDOMEN AND PELVIS WITHOUT CONTRAST TECHNIQUE: Multidetector CT imaging of the abdomen and pelvis was performed  following the standard protocol without IV contrast. RADIATION DOSE REDUCTION: This exam was performed according to the departmental dose-optimization program which includes automated exposure control, adjustment of the mA and/or kV according to patient size and/or use of iterative reconstruction technique. COMPARISON:  None Available. FINDINGS: Lower chest: Mild atelectasis is seen within the bilateral lung bases. Hepatobiliary: No focal liver abnormality is seen. No gallstones, gallbladder wall thickening, or biliary dilatation. Pancreas: Unremarkable. No pancreatic ductal dilatation or surrounding inflammatory changes. Spleen: Normal in size without focal abnormality. Adrenals/Urinary Tract: Adrenal glands are unremarkable. The kidneys are slightly small in size., without renal calculi, focal lesion, or hydronephrosis. Bladder is unremarkable. Stomach/Bowel: There is a small hiatal hernia. Appendix appears normal. Stool is seen throughout the large bowel. This is most prominent within the distal sigmoid colon and rectum. No evidence of bowel wall thickening, distention, or inflammatory changes. Noninflamed diverticula are seen throughout the ascending colon. Vascular/Lymphatic: No significant vascular findings are present. No enlarged abdominal or pelvic lymph nodes. Reproductive: Status post hysterectomy. No adnexal masses. Other: A 1.9 cm x 1.2 cm fat-containing umbilical hernia is noted. No abdominopelvic ascites. Musculoskeletal: Marked severity multilevel degenerative changes are seen throughout the lumbar spine. IMPRESSION: 1. Small hiatal hernia. 2. Colonic diverticulosis. 3. Large stool burden with impacted stool noted within the distal sigmoid colon and rectum. Electronically Signed   By: Virgina Norfolk M.D.   On: 12/18/2021 01:49   CT  Head Wo Contrast  Result Date: 12/18/2021 CLINICAL DATA:  Syncopal episode EXAM: CT HEAD WITHOUT CONTRAST TECHNIQUE: Contiguous axial images were obtained from the base of the skull through the vertex without intravenous contrast. RADIATION DOSE REDUCTION: This exam was performed according to the departmental dose-optimization program which includes automated exposure control, adjustment of the mA and/or kV according to patient size and/or use of iterative reconstruction technique. COMPARISON:  04/10/2019 FINDINGS: Brain: No evidence of acute infarction, hemorrhage, mass, mass effect, or midline shift. No hydrocephalus or extra-axial fluid collection. Vascular: No hyperdense vessel. Skull: Normal. Negative for fracture or focal lesion. Sinuses/Orbits: No acute finding. Other: The mastoid air cells are well aerated. IMPRESSION: No acute intracranial process. Electronically Signed   By: Merilyn Baba M.D.   On: 12/18/2021 01:34   DG Chest Port 1 View  Result Date: 12/18/2021 CLINICAL DATA:  Possible sepsis.  Loss of consciousness. EXAM: PORTABLE CHEST 1 VIEW COMPARISON:  04/10/2019. FINDINGS: The heart size and mediastinal contours are stable. Lung volumes are low. No consolidation, effusion, or pneumothorax. No acute osseous abnormality. IMPRESSION: No active disease. Electronically Signed   By: Brett Fairy M.D.   On: 12/18/2021 00:16    Assessment and Plan:   SYNCOPE:   I certainly think this could be related to her conduction disturbance.  She has a significant first-degree AV block and wide right bundle branch block.  Unprovoked overnights there were some slower heart rates as above.  She has been on beta-blockers.  There are some extenuating circumstances to include constipation and possibly GI bleeding.  It could have been a vagal component to this however I think this may have been a primary arrhythmic event and it might be reasonable to consider pacing now.  I had a long discussion with her and  her sisters about this.  I will get Dr. Caryl Comes to see her tomorrow to further evaluate.  GI bleed: Patient was not anemic but will have follow-up CBCs and might need further work-up.  HTN:  Currently her blood pressure is controlled.  Continue without AV nodal blocking agents   For questions or updates, please contact [place] Please consult [email or web address] for contact info under Cardiology/STEMI.   Signed, Minus Breeding, MD  12/18/2021 12:15 PM

## 2021-12-18 NOTE — ED Notes (Signed)
Upon transfer to ED stretcher bed from EMS bed, patient was transferred to nasal cannula, not tolerating well. Oxygen levels stayed in 84% 5L, switched back onto a nonrebreather.

## 2021-12-18 NOTE — ED Notes (Signed)
Patient noted to have hives growing on both legs and inner arms, no itching. EDP notified. Vancomycin stopped.

## 2021-12-18 NOTE — Assessment & Plan Note (Addendum)
AKI vs undiagnosed CKD, unclear what her baseline is. If AKI, possibly pre-renal from initial hypotension? 1. Got 2L IVF in ED 2. Repeat BMP in AM 3. Intake and output. 4. Hold home BP meds including diuretics. 5. UA pending

## 2021-12-18 NOTE — ED Notes (Signed)
Patient had three vomiting episodes since arrival to room, patient at this time unable to open mouth nor sit up or turn head. RN at bedside suctioning patient mouth and turning patient head. Tolerated well with RN assistance.

## 2021-12-18 NOTE — Assessment & Plan Note (Signed)
Hold home BP meds given initial hypotension at home.

## 2021-12-18 NOTE — ED Notes (Signed)
Patient transported to CT 

## 2021-12-18 NOTE — Consult Note (Addendum)
Consultation  Referring Provider: [facility]/ Antonieta Pert Primary Care Physician:  Lucianne Lei, MD Primary Gastroenterologist:  Dr.Mann  Reason for Consultation: Blood in stool  HPI: Christine Salazar is a 61 y.o. female, established with Dr. Juanita Craver, and apparently last seen in 2018 when she underwent colonoscopy which by patient's report was negative. Patient was admitted late last evening after a syncopal episode at home.  Patient says that she had developed a sharp pain in her lower abdomen, had not had a bowel movement for couple of days, went to the bathroom, apparently did have a bowel movement which her sisters report appeared normal and no blood present.  Patient passed out while in the bathroom and remained unconscious for at least a minute.  EMS was called and blood pressure was 64/32 with initial check.  She did vomit once at home She had fluid boluses. CT of the abdomen and pelvis by renal protocol last night showed a small hiatal hernia, stool throughout the large bowel and diverticulosis, no evidence of bowel wall thickening or inflammatory change small umbilical hernia  D-dimer was elevated and VQ scan done and negative Troponins negative EKG showed a chronic right bundle branch block and a long PR interval, second EKG with bigeminy CT of head no acute changes  Cardiology has evaluated today and feel that her syncopal episode was likely cardiogenic due to conduction disturbance first-degree AV block and wide right bundle branch block.  Pacing is to be considered  Patient did get a heparin bolus in the emergency room which has since been discontinued.  Patient has not had any abdominal pain today, she has had a couple of bowel movements, and it was noted that she had some red blood on the tissue, mucousy and some mucousy red blood in the hat with apparently normal-appearing stool.  Labs today WBC 15.3/hemoglobin 13.8/hematocrit 41> repeat hemoglobin 12.9 Potassium 3.3 BUN  30/creatinine 1.74 UA negative  Past Medical History:  Diagnosis Date   Hearing loss    Hypertension    Morton's neuroma of left foot     Past Surgical History:  Procedure Laterality Date   NEURECTOMY FOOT      Prior to Admission medications   Medication Sig Start Date End Date Taking? Authorizing Provider  amLODipine (NORVASC) 5 MG tablet Take 5 mg by mouth daily.  05/31/13  Yes [provider]  carvedilol (COREG) 12.5 MG tablet Take 12.5 mg by mouth 2 (two) times daily. 04/03/19  Yes [provider]  spironolactone-hydrochlorothiazide (ALDACTAZIDE) 25-25 MG tablet Take 1 tablet by mouth every morning. 03/26/19  Yes [provider]    Current Facility-Administered Medications  Medication Dose Route Frequency Provider Last Rate Last Admin   0.9 %  sodium chloride infusion   Intravenous Continuous Kc, Ramesh, MD 50 mL/hr at 12/18/21 1250 New Bag at 12/18/21 1250   acetaminophen (TYLENOL) tablet 650 mg  650 mg Oral Q6H PRN Etta Quill, DO       Or   acetaminophen (TYLENOL) suppository 650 mg  650 mg Rectal Q6H PRN Etta Quill, DO       bisacodyl (DULCOLAX) EC tablet 5 mg  5 mg Oral Daily PRN Etta Quill, DO       docusate sodium (COLACE) capsule 100 mg  100 mg Oral BID Jennette Kettle M, DO   100 mg at 12/18/21 1138   ondansetron (ZOFRAN) tablet 4 mg  4 mg Oral Q6H PRN Etta Quill, DO  Or   ondansetron (ZOFRAN) injection 4 mg  4 mg Intravenous Q6H PRN Etta Quill, DO       senna (SENOKOT) tablet 8.6 mg  1 tablet Oral BID Jennette Kettle M, DO   8.6 mg at 12/18/21 1138   sodium chloride flush (NS) 0.9 % injection 3 mL  3 mL Intravenous Q12H Jennette Kettle M, DO   3 mL at 12/18/21 1138    Allergies as of 12/17/2021   (No Known Allergies)    Family History  Problem Relation Age of Onset   Stroke Mother    Aortic dissection Mother    Hearing loss Neg Hx     Social History   Socioeconomic History   Marital status: Married     Spouse name: Not on file   Number of children: Not on file   Years of education: Not on file   Highest education level: Not on file  Occupational History   Not on file  Tobacco Use   Smoking status: Never   Smokeless tobacco: Never  Substance and Sexual Activity   Alcohol use: Not on file   Drug use: Not on file   Sexual activity: Not on file  Other Topics Concern   Not on file  Social History Narrative   Lives with brother and mom who died with hospice two years ago.     Social Determinants of Health   Financial Resource Strain: Not on file  Food Insecurity: Not on file  Transportation Needs: Not on file  Physical Activity: Not on file  Stress: Not on file  Social Connections: Not on file  Intimate Partner Violence: Not on file    Review of Systems: Pertinent positive and negative review of systems were noted in the above HPI section.  All other review of systems was otherwise negative.  Physical Exam: Vital signs in last 24 hours: Temp:  [96.9 F (36.1 C)-97.9 F (36.6 C)] 97.8 F (36.6 C) (10/28 0943) Pulse Rate:  [43-77] 66 (10/28 0943) Resp:  [15-26] 18 (10/28 0943) BP: (93-144)/(49-108) 108/71 (10/28 0943) SpO2:  [96 %-100 %] 98 % (10/28 0943) Weight:  [91.8 kg] 91.8 kg (10/28 0648) Last BM Date : 12/18/21 General:   Alert,  Well-developed, well-nourished, AA female pleasant and cooperative in NAD hard of hearing, sisters at bedside Head:  Normocephalic and atraumatic. Eyes:  Sclera clear, no icterus.   Conjunctiva pink. Ears:  Normal auditory acuity. Nose:  No deformity, discharge,  or lesions. Mouth:  No deformity or lesions.   Neck:  Supple; no masses or thyromegaly. Lungs:  Clear throughout to auscultation.   No wheezes, crackles, or rhonchi. Heart:  Regular rate and rhythm; no murmurs, clicks, rubs,  or gallops. Abdomen:  Soft,nontender, BS active,nonpalp mass or hsm.   Rectal: No external lesions noted, on digital exam decreased sphincter tone and  mucoid heme on examining glove Msk:  Symmetrical without gross deformities. . Pulses:  Normal pulses noted. Extremities:  Without clubbing or edema. Neurologic:  Alert and  oriented x4;  grossly normal neurologically. Skin:  Intact without significant lesions or rashes.. Psych:  Alert and cooperative. Normal mood and affect.  Intake/Output from previous day: 10/27 0701 - 10/28 0700 In: 2077.7 [I.V.:500; IV Piggyback:1577.7] Out: -  Intake/Output this shift: Total I/O In: 45.8 [I.V.:45.8] Out: -   Lab Results: Recent Labs    12/17/21 2304 12/18/21 0348 12/18/21 1418  WBC 10.7* 15.3*  --   HGB 13.4 13.8 12.9  HCT 38.5  41.0 37.9  PLT 282 249  --    BMET Recent Labs    12/17/21 2304 12/18/21 0348  NA 140 140  K 3.2* 3.3*  CL 105 105  CO2 23 25  GLUCOSE 178* 126*  BUN 32* 30*  CREATININE 1.92* 1.74*  CALCIUM 9.4 9.4   LFT Recent Labs    12/17/21 2304  PROT 7.0  ALBUMIN 3.4*  AST 38  ALT 25  ALKPHOS 71  BILITOT 0.4   PT/INR Recent Labs    12/17/21 2304  LABPROT 13.9  INR 1.1    IMPRESSION:  #35 61 year old African-American female brought to the emergency room after prolonged syncopal episode at home, hypotensive with systolic blood pressure 64 on EMS arrival. Patient had developed a sharp pain in her lower abdomen, went to the bathroom, had a bowel movement with no blood and syncopized in the bathroom.  Cardiology  has evaluated, patient has a first-degree AV block with wide right bundle branch block and it is felt that her syncopal episode was likely cardiogenic, Pacemaker being considered  Patient has developed some mucoid blood with bowel movements today on 2 occasions No complaints of abdominal pain or rectal pain Noncontrasted CT/renal protocol last p.m. large amount of stool in the colon, no bowel wall thickening or inflammatory changes, diverticulosis  Etiology of the mucoid blood is not clear-she certainly may have had an episode of mild  ischemic colitis, though no bowel wall inflammation appreciated on noncontrast CT.  She does have an elevated WBC at 15.3 which can be seen with ischemic colitis as well. Consider secondary to internal hemorrhoids or other mucosal inflammation  No Evidence of any significant GI bleeding at present  Negative colonoscopy 2018  #2 acute kidney injury secondary to above #3 hearing loss #4  History of hypertension  PLAN: #1 regular diet is okay as she has no current abdominal complaints #2 trend hemoglobin every 6-8 hours #3 observe for now, if she has persistence of low-grade bleeding will need to consider either repeat contrasted CT or repeat colonoscopy though would like to avoid sedation if possible with her arrhythmia until she undergoes further cardiac evaluation/management  Amy Esterwood PA-C 12/18/2021, 3:59 PM     [facility] Attending   I have taken an interval history, reviewed the chart and examined the patient. I agree with the Advanced Practitioner's note, impression and recommendations.   Gatha Mayer, MD, [facility] See Shea Evans on call - gastroenterology for best contact person 12/18/2021 5:13 PM

## 2021-12-18 NOTE — Assessment & Plan Note (Addendum)
Apparently her acute constipation seen on CT is already resolved / resolving with 2 very large BMs in the past 1 hour thus far per ED RN. 1. Add dulcolax PRN and scheduled stool softener.

## 2021-12-19 ENCOUNTER — Encounter (HOSPITAL_COMMUNITY): Payer: Self-pay | Admitting: Internal Medicine

## 2021-12-19 DIAGNOSIS — T17908A Unspecified foreign body in respiratory tract, part unspecified causing other injury, initial encounter: Secondary | ICD-10-CM | POA: Diagnosis not present

## 2021-12-19 DIAGNOSIS — I440 Atrioventricular block, first degree: Secondary | ICD-10-CM | POA: Diagnosis not present

## 2021-12-19 DIAGNOSIS — R55 Syncope and collapse: Secondary | ICD-10-CM | POA: Diagnosis not present

## 2021-12-19 DIAGNOSIS — N179 Acute kidney failure, unspecified: Secondary | ICD-10-CM | POA: Diagnosis not present

## 2021-12-19 DIAGNOSIS — I4510 Unspecified right bundle-branch block: Secondary | ICD-10-CM

## 2021-12-19 DIAGNOSIS — K921 Melena: Secondary | ICD-10-CM

## 2021-12-19 LAB — COMPREHENSIVE METABOLIC PANEL
ALT: 40 U/L (ref 0–44)
AST: 47 U/L — ABNORMAL HIGH (ref 15–41)
Albumin: 2.9 g/dL — ABNORMAL LOW (ref 3.5–5.0)
Alkaline Phosphatase: 61 U/L (ref 38–126)
Anion gap: 9 (ref 5–15)
BUN: 13 mg/dL (ref 6–20)
CO2: 24 mmol/L (ref 22–32)
Calcium: 8.9 mg/dL (ref 8.9–10.3)
Chloride: 109 mmol/L (ref 98–111)
Creatinine, Ser: 1.61 mg/dL — ABNORMAL HIGH (ref 0.44–1.00)
GFR, Estimated: 36 mL/min — ABNORMAL LOW (ref >60)
Glucose, Bld: 106 mg/dL — ABNORMAL HIGH (ref 70–99)
Potassium: 3.6 mmol/L (ref 3.5–5.1)
Sodium: 142 mmol/L (ref 135–145)
Total Bilirubin: 0.4 mg/dL (ref 0.3–1.2)
Total Protein: 6.1 g/dL — ABNORMAL LOW (ref 6.5–8.1)

## 2021-12-19 LAB — CBC
HCT: 34.2 % — ABNORMAL LOW (ref 36.0–46.0)
Hemoglobin: 11.7 g/dL — ABNORMAL LOW (ref 12.0–15.0)
MCH: 33 pg (ref 26.0–34.0)
MCHC: 34.2 g/dL (ref 30.0–36.0)
MCV: 96.3 fL (ref 80.0–100.0)
Platelets: 217 10*3/uL (ref 150–400)
RBC: 3.55 MIL/uL — ABNORMAL LOW (ref 3.87–5.11)
RDW: 12.3 % (ref 11.5–15.5)
WBC: 11.8 10*3/uL — ABNORMAL HIGH (ref 4.0–10.5)
nRBC: 0 % (ref 0.0–0.2)

## 2021-12-19 LAB — URINE CULTURE
Culture: NO GROWTH
Special Requests: NORMAL

## 2021-12-19 LAB — HEMOGLOBIN AND HEMATOCRIT, BLOOD
HCT: 34.9 % — ABNORMAL LOW (ref 36.0–46.0)
HCT: 35.2 % — ABNORMAL LOW (ref 36.0–46.0)
Hemoglobin: 11.9 g/dL — ABNORMAL LOW (ref 12.0–15.0)
Hemoglobin: 12.5 g/dL (ref 12.0–15.0)

## 2021-12-19 LAB — GLUCOSE, CAPILLARY: Glucose-Capillary: 106 mg/dL — ABNORMAL HIGH (ref 70–99)

## 2021-12-19 MED ORDER — POTASSIUM CHLORIDE CRYS ER 20 MEQ PO TBCR
40.0000 meq | EXTENDED_RELEASE_TABLET | Freq: Once | ORAL | Status: AC
  Administered 2021-12-19: 40 meq via ORAL
  Filled 2021-12-19: qty 2

## 2021-12-19 NOTE — Progress Notes (Signed)
   Patient Name: Christine Salazar Date of Encounter: 12/19/2021, 11:01 AM    Subjective  No stools so no rectal bleeding since yesterday.  Syncope work-up ongoing.    Objective  BP 131/72 (BP Location: Right Arm)   Pulse 84   Temp 98 F (36.7 C) (Oral)   Resp 17   Ht '5\' 9"'$  (1.753 m)   Wt 91.8 kg   SpO2 99%   BMI 29.89 kg/m      Latest Ref Rng & Units 12/19/2021   10:26 AM 12/19/2021    2:55 AM 12/18/2021    6:02 PM  CBC  WBC 4.0 - 10.5 K/uL 11.8     Hemoglobin 12.0 - 15.0 g/dL 11.7  11.9  13.0   Hematocrit 36.0 - 46.0 % 34.2  34.9  36.9   Platelets 150 - 400 K/uL 217          Assessment and Plan  Blood in stool, cause not clear question if she had an episode of ischemic colitis.  No stools since yesterday and that was only mucoid bloody discharge.  Hemoglobin has declined slightly but nothing has changed to warrant endoscopic investigation at this point.   I will notify Dr. Collene Mares (primary GI) but the patient is in the hospital and she can determine follow-up tomorrow.   Gatha Mayer, MD, [facility] See Shea Evans on call - gastroenterology for best contact person 12/19/2021 11:01 AM

## 2021-12-19 NOTE — Consult Note (Signed)
ELECTROPHYSIOLOGY CONSULT NOTE  Patient ID: Christine Salazar, MRN: [id number], DOB/AGE: 1960-07-09 61 y.o. Admit date: 12/17/2021 Date of Consult: 12/19/2021  Primary Physician: Lucianne Lei, MD Primary Cardiologist: [facility] Christine Salazar is a 61 y.o. female who is being seen today for the evaluation of syncope at the request of [facility].   Chief Complaint: syncop[e   HPI Christine Salazar is a 61 y.o. female seen for syncope.  Longstanding RBBB and 1 AVB (2021).  No limitations in exercise tolerance.  No nocturnal dyspnea or exertional chest discomfort.  Prior episode of syncope associated with GI bleeding. Yesterday went to the commode with the need to defecate with some GI discomfort.  She became weak on the commode stood up and then collapsed.  Hit her head in the shower. BP 69/42 on EMS arrival /pulse 77 and persistent hypotension despite normal HR .  Recovery symptoms notable for flushing, some diaphoresis  No history of brief presyncope.  Found to have guaiac positive stool.  Modest change in baseline hemoglobin as well as further change overnight.   Known  ECG  DATE TEST EF   10/23 Echo  55-60%              Date Cr K Hgb  2/21 1.44  15.6  10/23 1.61 3.6 11.7<12.9           Past Medical History:  Diagnosis Date   1st degree AV block    Hearing loss    Hypertension    Morton's neuroma of left foot    Right bundle branch block    Syncope        Surgical History:  Past Surgical History:  Procedure Laterality Date   NEURECTOMY FOOT       Home Meds: Prior to Admission medications   Medication Sig Start Date End Date Taking? Authorizing Provider  amLODipine (NORVASC) 5 MG tablet Take 5 mg by mouth daily.  05/31/13  Yes [provider]  carvedilol (COREG) 12.5 MG tablet Take 12.5 mg by mouth 2 (two) times daily. 04/03/19  Yes [provider]  spironolactone-hydrochlorothiazide (ALDACTAZIDE) 25-25 MG tablet Take 1 tablet by mouth every  morning. 03/26/19  Yes [provider]    Inpatient Medications:   docusate sodium  100 mg Oral BID   senna  1 tablet Oral BID   sodium chloride flush  3 mL Intravenous Q12H     Allergies:  Allergies  Allergen Reactions   Vancomycin Hives    Arms and legs, seems to be true allergy and not just red-man syndrome.    Social History   Socioeconomic History   Marital status: Married    Spouse name: Not on file   Number of children: Not on file   Years of education: Not on file   Highest education level: Not on file  Occupational History   Not on file  Tobacco Use   Smoking status: Never   Smokeless tobacco: Never  Substance and Sexual Activity   Alcohol use: Not on file   Drug use: Not on file   Sexual activity: Not on file  Other Topics Concern   Not on file  Social History Narrative   Lives with brother and mom who died with hospice two years ago.     Social Determinants of Health   Financial Resource Strain: Not on file  Food Insecurity: Not on file  Transportation Needs: Not on file  Physical Activity: Not on file  Stress: Not  on file  Social Connections: Not on file  Intimate Partner Violence: Not on file     Family History  Problem Relation Age of Onset   Stroke Mother    Aortic dissection Mother    Hearing loss Neg Hx      ROS:  Please see the history of present illness.     All other systems reviewed and negative.    Physical Exam: Blood pressure 131/72, pulse 84, temperature 98 F (36.7 C), temperature source Oral, resp. rate 17, height '5\' 9"'$  (1.753 m), weight 91.8 kg, SpO2 99 %. General: Well developed, well nourished female in no acute distress. Head: Normocephalic, atraumatic, sclera non-icteric, no xanthomas, nares are without discharge. EENT: normal Lymph Nodes:  none Back: without scoliosis/kyphosis, no CVA tendersness Neck: Negative for carotid bruits. JVD not elevated. Lungs: Clear bilaterally to auscultation without wheezes,  rales, or rhonchi. Breathing is unlabored. Heart: RRR with S1 S2. No murmur , rubs, or gallops appreciated. Abdomen: Soft, non-tender, non-distended with normoactive bowel sounds. No hepatomegaly. No rebound/guarding. No obvious abdominal masses. Msk:  Strength and tone appear normal for age. Extremities: No clubbing or cyanosis. No  edema.  Distal pedal pulses are 2+ and equal bilaterally. Skin: Warm and Dry Neuro: Alert and oriented X 3. CN III-XII intact Grossly normal sensory and motor function . Psych:  Responds to questions appropriately with a normal affect.      Labs: Cardiac Enzymes No results for input(s): "CKTOTAL", "CKMB", "TROPONINI" in the last 72 hours. CBC Lab Results  Component Value Date   WBC 11.8 (H) 12/19/2021   HGB 11.7 (L) 12/19/2021   HCT 34.2 (L) 12/19/2021   MCV 96.3 12/19/2021   PLT 217 12/19/2021   PROTIME: Recent Labs    12/17/21 2304  LABPROT 13.9  INR 1.1   Chemistry  Recent Labs  Lab 12/19/21 1026  NA 142  K 3.6  CL 109  CO2 24  BUN 13  CREATININE 1.61*  CALCIUM 8.9  PROT 6.1*  BILITOT 0.4  ALKPHOS 61  ALT 40  AST 47*  GLUCOSE 106*   Lipids No results found for: "CHOL", "HDL", "LDLCALC", "TRIG" BNP No results found for: "PROBNP" Thyroid Function Tests: Recent Labs    12/18/21 0348  TSH 1.249           EKG: 10/28  sinus 1 AVB 356 10/27 2310  sinus with PACs and PVCs and couplets 10/27 2308 sinus 1AVB (326)  axis 53 2/21 sinus 1AVB (286) RBBB     Assessment and Plan:  Syncope-neurally mediated  Right bundle branch block and first-degree AV block  GI bleed  Renal insufficiency question acute versus chronic   The patient has a syncopal episode associate with protracted hypotension in the setting of documented normal heart rates.  This is most consistent with a vasomotor event, and this is certainly contextually likely off the commode.  Has previous episode in the more remote past with a similar recovery phase  symptom complex  The presence of right bundle branch block is certainly less worrisome than right bundle branch block with fascicular block.  The first-degree AV block is quite profound and of itself does not increase the likelihood of syncope.  It can impair exercise tolerance but she has no symptoms to suggest encroachment on the antecedent QRS of the subsequent P wave.  We are available for further questions.   Virl Axe

## 2021-12-19 NOTE — Plan of Care (Signed)
  Problem: Education: Goal: Knowledge of condition and prescribed therapy will improve Outcome: Progressing   Problem: Physical Regulation: Goal: Complications related to the disease process, condition or treatment will be avoided or minimized Outcome: Progressing

## 2021-12-19 NOTE — Progress Notes (Signed)
PROGRESS NOTE  Christine Salazar VOH:[id number] DOB: [birth year] DOA: 12/17/2021 PCP: Lucianne Lei, MD  HPI/Recap of past 24 hours: Christine Salazar is a 61 y.o. female with medical history significant of hearing loss, HTN. Pt presents to ED following syncopal episode and unresponsiveness at home while trying to use restroom. Initially hypotensive with EMS, BP 64/32, BP normalized. Noted an episode of vomiting, had to be suctioned.  Initially requiring NRB in ED, now on RA she has been weaned down and is now satting in upper 90s on RA. Noted to have hives post vancomycin.  Work-up in the ED fairly unremarkable except for possible AKI on CKD, significant constipation, elevated D-dimer with negative BLE Doppler, negative VQ scan, stable echocardiogram.  EKG noted to have first-degree AV block, cardiology/EP consulted.  Patient was also noted to have some mucus mixed with blood in her stool, noted to be FOBT positive.  GI on board.  Patient admitted for further management.   Today, patient denies any new complaints, no further bleeding noted although has not had any bowel movements today.  Denies any dizziness upon ambulating to the bathroom and back.  Denies any chest pain, abdominal pain, nausea/vomiting, fever/chills.    Assessment/Plan: Principal Problem:   Syncope Active Problems:   Aspiration into airway   AKI (acute kidney injury) (HCC)   Constipation, chronic   HTN (hypertension)   Hyperglycemia   Blood in stool   RBBB (right bundle branch block)   1st degree AV block    Syncope Likely vasovagal/micturition etiology Vs first-degree heart block Vs orthostatic hypotension Vs GIB Orthostatics vitals pending Troponin unremarkable CT head unremarkable EEG unremarkable Echo with EF of Cardiology/EP consulted, unlikely syncope due to first-degree heart block Orthostatic vitals daily Continue gentle hydration Fall precautions, PT/OT Telemetry, monitor closely  Possible  lower GI bleed Noted smear of blood on wiping, FOBT positive, mucus and blood mixed in stool ?Ischemic colitis Vs significant constipation Vs hemorrhoids CT abdomen/pelvis unremarkable except for large stool burden GI consulted, recommend frequent H&H, and to monitor closely Frequent H&H  Elevated D-dimer Hemodynamically stable, unsure etiology LE Doppler negative VQ scan negative  Leukocytosis Currently afebrile, possible aspiration event Vs reactive Procalcitonin elevated 4.23, elevated D-dimer Initial chest x-ray unremarkable UA unremarkable, UC no growth BC x2 NGTD Daily CBC, monitor for any fever  AKI on CKD stage IIIb Unknown baseline, but patient has been told she has CKD Improving On gentle hydration, continue Daily BMP      Estimated body mass index is 29.89 kg/m as calculated from the following:   Height as of this encounter: '5\' 9"'$  (1.753 m).   Weight as of this encounter: 91.8 kg.     Code Status: Full  Family Communication: Discussed with sister at bedside  Disposition Plan: Status is: Inpatient Remains inpatient appropriate because: Level of care      Consultants: Cardiology/EP GI  Procedures: None  Antimicrobials: None  DVT prophylaxis: SCDs due to rectal bleed   Objective: Vitals:   12/18/21 2046 12/19/21 0512 12/19/21 0941 12/19/21 1237  BP: 116/69 (!) 107/57 131/72 (!) 141/81  Pulse: 81 83 84 69  Resp: '14 13 17 18  '$ Temp: 98.7 F (37.1 C) 98.3 F (36.8 C) 98 F (36.7 C) 98.2 F (36.8 C)  TempSrc: Oral Oral Oral Oral  SpO2: 97% 95% 99% 97%  Weight:      Height:        Intake/Output Summary (Last 24 hours) at 12/19/2021 1405 Last data filed  at 12/19/2021 0900 Gross per 24 hour  Intake 1168.1 ml  Output --  Net 1168.1 ml   Filed Weights   12/18/21 0648  Weight: 91.8 kg    Exam:  General: NAD  Cardiovascular: S1, S2 present Respiratory: CTAB Abdomen: Soft, nontender, nondistended, bowel sounds  present Musculoskeletal: No bilateral pedal edema noted Skin: Normal Psychiatry: Normal mood   Data Reviewed: CBC: Recent Labs  Lab 12/17/21 2304 12/18/21 0348 12/18/21 1418 12/18/21 1802 12/19/21 0255 12/19/21 1026  WBC 10.7* 15.3*  --   --   --  11.8*  NEUTROABS 5.7  --   --   --   --   --   HGB 13.4 13.8 12.9 13.0 11.9* 11.7*  HCT 38.5 41.0 37.9 36.9 34.9* 34.2*  MCV 95.5 95.6  --   --   --  96.3  PLT 282 249  --   --   --  761   Basic Metabolic Panel: Recent Labs  Lab 12/17/21 2304 12/18/21 0348 12/19/21 1026  NA 140 140 142  K 3.2* 3.3* 3.6  CL 105 105 109  CO2 '23 25 24  '$ GLUCOSE 178* 126* 106*  BUN 32* 30* 13  CREATININE 1.92* 1.74* 1.61*  CALCIUM 9.4 9.4 8.9  MG  --  1.7  --    GFR: Estimated Creatinine Clearance: 44.8 mL/min (A) (by C-G formula based on SCr of 1.61 mg/dL (H)). Liver Function Tests: Recent Labs  Lab 12/17/21 2304 12/19/21 1026  AST 38 47*  ALT 25 40  ALKPHOS 71 61  BILITOT 0.4 0.4  PROT 7.0 6.1*  ALBUMIN 3.4* 2.9*   No results for input(s): "LIPASE", "AMYLASE" in the last 168 hours. No results for input(s): "AMMONIA" in the last 168 hours. Coagulation Profile: Recent Labs  Lab 12/17/21 2304  INR 1.1   Cardiac Enzymes: No results for input(s): "CKTOTAL", "CKMB", "CKMBINDEX", "TROPONINI" in the last 168 hours. BNP (last 3 results) No results for input(s): "PROBNP" in the last 8760 hours. HbA1C: Recent Labs    12/18/21 0348  HGBA1C 5.3   CBG: Recent Labs  Lab 12/17/21 2304 12/18/21 0827 12/19/21 0656  GLUCAP 181* 147* 106*   Lipid Profile: No results for input(s): "CHOL", "HDL", "LDLCALC", "TRIG", "CHOLHDL", "LDLDIRECT" in the last 72 hours. Thyroid Function Tests: Recent Labs    12/18/21 0348  TSH 1.249   Anemia Panel: No results for input(s): "VITAMINB12", "FOLATE", "FERRITIN", "TIBC", "IRON", "RETICCTPCT" in the last 72 hours. Urine analysis:    Component Value Date/Time   COLORURINE YELLOW 12/18/2021  [place] 12/18/2021 0539   LABSPEC 1.018 12/18/2021 0539   PHURINE 5.0 12/18/2021 0539   GLUCOSEU NEGATIVE 12/18/2021 0539   HGBUR NEGATIVE 12/18/2021 0539   BILIRUBINUR NEGATIVE 12/18/2021 0539   KETONESUR NEGATIVE 12/18/2021 0539   PROTEINUR NEGATIVE 12/18/2021 0539   NITRITE NEGATIVE 12/18/2021 0539   LEUKOCYTESUR NEGATIVE 12/18/2021 0539   Sepsis Labs: '@LABRCNTIP'$ (procalcitonin:4,lacticidven:4)  ) Recent Results (from the past 240 hour(s))  Resp Panel by RT-PCR (Flu A&B, Covid) Anterior Nasal Swab     Status: None   Collection Time: 12/17/21 11:04 PM   Specimen: Anterior Nasal Swab  Result Value Ref Range Status   SARS Coronavirus 2 by RT PCR NEGATIVE NEGATIVE Final    Comment: (NOTE) SARS-CoV-2 target nucleic acids are NOT DETECTED.  The SARS-CoV-2 RNA is generally detectable in upper respiratory specimens during the acute phase of infection. The lowest concentration of SARS-CoV-2 viral copies this assay can detect is  138 copies/mL. A negative result does not preclude SARS-Cov-2 infection and should not be used as the sole basis for treatment or other patient management decisions. A negative result may occur with  improper specimen collection/handling, submission of specimen other than nasopharyngeal swab, presence of viral mutation(s) within the areas targeted by this assay, and inadequate number of viral copies(<138 copies/mL). A negative result must be combined with clinical observations, patient history, and epidemiological information. The expected result is Negative.  Fact Sheet for Patients:  EntrepreneurPulse.com.au  Fact Sheet for Healthcare Providers:  IncredibleEmployment.be  This test is no t yet approved or cleared by the [place] FDA and  has been authorized for detection and/or diagnosis of SARS-CoV-2 by FDA under an Emergency Use Authorization (EUA). This EUA will remain  in effect (meaning  this test can be used) for the duration of the COVID-19 declaration under Section 564(b)(1) of the Act, 21 U.S.C.section 360bbb-3(b)(1), unless the authorization is terminated  or revoked sooner.       Influenza A by PCR NEGATIVE NEGATIVE Final   Influenza B by PCR NEGATIVE NEGATIVE Final    Comment: (NOTE) The Xpert Xpress SARS-CoV-2/FLU/RSV plus assay is intended as an aid in the diagnosis of influenza from Nasopharyngeal swab specimens and should not be used as a sole basis for treatment. Nasal washings and aspirates are unacceptable for Xpert Xpress SARS-CoV-2/FLU/RSV testing.  Fact Sheet for Patients: EntrepreneurPulse.com.au  Fact Sheet for Healthcare Providers: IncredibleEmployment.be  This test is not yet approved or cleared by the [place] FDA and has been authorized for detection and/or diagnosis of SARS-CoV-2 by FDA under an Emergency Use Authorization (EUA). This EUA will remain in effect (meaning this test can be used) for the duration of the COVID-19 declaration under Section 564(b)(1) of the Act, 21 U.S.C. section 360bbb-3(b)(1), unless the authorization is terminated or revoked.  Performed at [facility], [place] [address], [place], [place] [address]   Blood Culture (routine x 2)     Status: None (Preliminary result)   Collection Time: 12/17/21 11:57 PM   Specimen: BLOOD  Result Value Ref Range Status   Specimen Description BLOOD RIGHT ANTECUBITAL  Final   Special Requests   Final    BOTTLES DRAWN AEROBIC AND ANAEROBIC Blood Culture results may not be optimal due to an excessive volume of blood received in culture bottles   Culture   Final    NO GROWTH < 12 HOURS Performed at [facility], [place] [address], [place], [place] [address]    Report Status PENDING  Incomplete  Blood Culture (routine x 2)     Status: None (Preliminary result)   Collection Time: 12/18/21 12:05 AM   Specimen: BLOOD LEFT HAND   Result Value Ref Range Status   Specimen Description BLOOD LEFT HAND  Final   Special Requests   Final    BOTTLES DRAWN AEROBIC ONLY Blood Culture adequate volume   Culture   Final    NO GROWTH < 12 HOURS Performed at [facility], [place] [address], [place], [place] [address]    Report Status PENDING  Incomplete  Urine Culture     Status: None   Collection Time: 12/18/21  5:39 AM   Specimen: In/Out Cath Urine  Result Value Ref Range Status   Specimen Description IN/OUT CATH URINE  Final   Special Requests Normal  Final   Culture   Final    NO GROWTH Performed at [facility], [address] [place], [place] [address]    Report Status 12/19/2021 FINAL  Final      Studies: EEG adult  Result Date: 2022-01-15 Lora Havens, MD     01/15/2022  9:33 PM Patient Name: Christine Salazar MRN: [id number] Epilepsy Attending: Lora Havens Referring Physician/Provider: Antonieta Pert, MD Date: 15-Jan-2022 Duration: 26.04 mins Patient history: 61yo F presents to ED following syncopal episode and unresponsiveness at home while trying to use restroom. Level of alertness: Awake, asleep AEDs during EEG study: None Technical aspects: This EEG study was done with scalp electrodes positioned according to the 10-20 International system of electrode placement. Electrical activity was reviewed with band pass filter of 1-'70Hz'$ , sensitivity of 7 uV/mm, display speed of 55m/sec with a '60Hz'$  notched filter applied as appropriate. EEG data were recorded continuously and digitally stored.  Video monitoring was available and reviewed as appropriate. Description: No clear posterior dominant rhythm consists of 9-10 Hz activity of moderate voltage (25-35 uV) seen predominantly in posterior head regions, symmetric and reactive to eye opening and eye closing. Sleep was characterized by vertex waves, sleep spindles (12 to 14 Hz), maximal frontocentral region.  There is low amplitude 15 to 18 Hz beta activity  distributed symmetrically and diffusely. Physiologic photic driving was not seen during photic stimulation.  Hyperventilation was not performed.   IMPRESSION: This study is within normal limits. No seizures or epileptiform discharges were seen throughout the recording. A normal interictal EEG does not exclude the diagnosis of epilepsy. Christine Salazar   Scheduled Meds:  docusate sodium  100 mg Oral BID   senna  1 tablet Oral BID   sodium chloride flush  3 mL Intravenous Q12H    Continuous Infusions:  sodium chloride 50 mL/hr at 12/19/21 0654     LOS: 1 day     NAlma Friendly MD [facility]  If 7PM-7AM, please contact night-coverage [email or web address] 12/19/2021, 2:05 PM

## 2021-12-20 ENCOUNTER — Encounter: Payer: Self-pay | Admitting: *Deleted

## 2021-12-20 ENCOUNTER — Other Ambulatory Visit: Payer: Self-pay | Admitting: Physician Assistant

## 2021-12-20 DIAGNOSIS — N179 Acute kidney failure, unspecified: Secondary | ICD-10-CM | POA: Diagnosis not present

## 2021-12-20 DIAGNOSIS — R55 Syncope and collapse: Secondary | ICD-10-CM | POA: Diagnosis not present

## 2021-12-20 DIAGNOSIS — I440 Atrioventricular block, first degree: Secondary | ICD-10-CM | POA: Diagnosis not present

## 2021-12-20 DIAGNOSIS — K921 Melena: Secondary | ICD-10-CM | POA: Diagnosis not present

## 2021-12-20 LAB — BASIC METABOLIC PANEL
Anion gap: 6 (ref 5–15)
BUN: 11 mg/dL (ref 6–20)
CO2: 24 mmol/L (ref 22–32)
Calcium: 9.3 mg/dL (ref 8.9–10.3)
Chloride: 111 mmol/L (ref 98–111)
Creatinine, Ser: 1.29 mg/dL — ABNORMAL HIGH (ref 0.44–1.00)
GFR, Estimated: 48 mL/min — ABNORMAL LOW (ref >60)
Glucose, Bld: 88 mg/dL (ref 70–99)
Potassium: 3.8 mmol/L (ref 3.5–5.1)
Sodium: 141 mmol/L (ref 135–145)

## 2021-12-20 LAB — PROCALCITONIN: Procalcitonin: 2.02 ng/mL

## 2021-12-20 LAB — CBC WITH DIFFERENTIAL/PLATELET
Abs Immature Granulocytes: 0.04 10*3/uL (ref 0.00–0.07)
Basophils Absolute: 0 10*3/uL (ref 0.0–0.1)
Basophils Relative: 0 %
Eosinophils Absolute: 0.2 10*3/uL (ref 0.0–0.5)
Eosinophils Relative: 2 %
HCT: 33.6 % — ABNORMAL LOW (ref 36.0–46.0)
Hemoglobin: 11.5 g/dL — ABNORMAL LOW (ref 12.0–15.0)
Immature Granulocytes: 0 %
Lymphocytes Relative: 26 %
Lymphs Abs: 2.8 10*3/uL (ref 0.7–4.0)
MCH: 32.9 pg (ref 26.0–34.0)
MCHC: 34.2 g/dL (ref 30.0–36.0)
MCV: 96 fL (ref 80.0–100.0)
Monocytes Absolute: 0.6 10*3/uL (ref 0.1–1.0)
Monocytes Relative: 6 %
Neutro Abs: 7.1 10*3/uL (ref 1.7–7.7)
Neutrophils Relative %: 66 %
Platelets: 222 10*3/uL (ref 150–400)
RBC: 3.5 MIL/uL — ABNORMAL LOW (ref 3.87–5.11)
RDW: 12.6 % (ref 11.5–15.5)
WBC: 10.8 10*3/uL — ABNORMAL HIGH (ref 4.0–10.5)
nRBC: 0 % (ref 0.0–0.2)

## 2021-12-20 NOTE — Discharge Summary (Signed)
Physician Discharge Summary   Patient: Christine Salazar MRN: [id number] DOB: 1960-03-12  Admit date:     12/17/2021  Discharge date: 12/20/21  Discharge Physician: Christine Salazar   PCP: Christine Lei, MD   Recommendations at discharge:   Follow-up with PCP Follow-up with cardiology for heart monitor and medication adjustments  Discharge Diagnoses: Principal Problem:   Syncope and collapse Active Problems:   Aspiration into airway   AKI (acute kidney injury) ([place])   Constipation, chronic   HTN (hypertension)   Hyperglycemia   Blood in stool   RBBB (right bundle branch block)   1st degree AV block    Hospital Course: Christine Salazar is a 61 y.o. female with medical history significant of hearing loss, HTN. Pt presents to ED following syncopal episode and unresponsiveness at home while trying to use restroom. Initially hypotensive with EMS, BP 64/32, BP normalized. Noted an episode of vomiting, had to be suctioned.  Initially requiring NRB in ED, now on RA she has been weaned down and is now satting in upper 90s on RA. Noted to have hives post vancomycin.  Work-up in the ED fairly unremarkable except for possible AKI on CKD, significant constipation, elevated D-dimer with negative BLE Doppler, negative VQ scan, stable echocardiogram.  EKG noted to have first-degree AV block, cardiology/EP consulted.  Patient was also noted to have some mucus mixed with blood in her stool, noted to be FOBT positive.  GI on board.  Patient admitted for further management.   Today, patient denies any dizziness or any further syncope.  Orthostatic negative.  Denies any other new complaints, no chest pain no shortness of breath, no fever/chills.  Follow-up with PCP as well as cardiology as scheduled.  Assessment and Plan:  Syncope Likely vasovagal/micturition etiology Vs first-degree heart block Vs orthostatic hypotension Vs GIB Vs medication induced hypotension Orthostatics vitals  negative Troponin unremarkable CT head unremarkable EEG unremarkable Echo with EF 55 to 60%, no regional wall motion abnormality Cardiology/EP consulted, unlikely syncope due to first-degree heart block, will set up heart event monitor for patient, and follow-up S/p gentle hydration PT/OT-no further follow-up   Possible lower GI bleed Noted smear of blood on wiping, FOBT positive, mucus and blood mixed in stool ?Ischemic colitis Vs significant constipation Vs hemorrhoids Hemoglobin remained stable CT abdomen/pelvis unremarkable except for large stool burden GI consulted, no further management, discussed with Dr. Collene Mares on 10/30-okay to discharge from GI standpoint Outpatient GI follow-up   Elevated D-dimer Hemodynamically stable, unsure etiology, saturating well on room air LE Doppler negative VQ scan negative   Leukocytosis Resolving Currently afebrile, possible aspiration event Vs reactive Procalcitonin elevated 4.23, elevated D-dimer Initial chest x-ray unremarkable UA unremarkable, UC no growth BC x2 NGTD Follow-up with PCP   AKI on CKD stage IIIb Unknown baseline, but patient has been told she has CKD Improving S/p gentle hydration Follow-up with PCP  Hypertension Patient's BP noted to be soft to stable throughout admission without any BP meds Syncope might have been related to medication induced hypotension Due to first-degree heart block, cardiology recommended discontinuing Coreg for now, also holding Aldactazide due to soft BP, continue amlodipine PCP to reevaluate and consider restarting Aldactazide pending BP readings Cardiology will decide on restarting versus completely discontinuing beta-blocker      Consultants: Cardiology, GI Procedures performed: None Disposition: Home Diet recommendation:  Cardiac diet  DISCHARGE MEDICATION: Allergies as of 12/20/2021       Reactions   Vancomycin Hives   Arms  and legs, seems to be true allergy and not just  red-man syndrome.        Medication List     STOP taking these medications    carvedilol 12.5 MG tablet Commonly known as: COREG   spironolactone-hydrochlorothiazide 25-25 MG tablet Commonly known as: ALDACTAZIDE       TAKE these medications    amLODipine 5 MG tablet Commonly known as: NORVASC Take 5 mg by mouth daily.        Follow-up Information     Christine Lei, MD. Schedule an appointment as soon as possible for a visit in 1 week(s).   Specialty: Family Medicine Contact information: [address] [place] [address] [phone]         Christine Records, MD Follow up.   Specialty: Cardiology Why: Office will arrange a heart monitor Contact information: [place] [address] [phone]                Discharge Exam: Christine Salazar Weights   12/18/21 6606 12/20/21 0602  Weight: 91.8 kg 91.7 kg   General: NAD  Cardiovascular: S1, S2 present Respiratory: CTAB Abdomen: Soft, nontender, nondistended, bowel sounds present Musculoskeletal: No bilateral pedal edema noted Skin: Normal Psychiatry: Normal mood   Condition at discharge: stable  The results of significant diagnostics from this hospitalization (including imaging, microbiology, ancillary and laboratory) are listed below for reference.   Imaging Studies: VAS [place] LOWER EXTREMITY VENOUS (DVT)  Result Date: 12/19/2021  Lower Venous DVT Study Patient Name:  Christine Salazar  Date of Exam:   12/18/2021 Medical Rec #: [id number]             Accession #:    [id number] Date of Birth: 08-02-1960             Patient Gender: F Patient Age:   33 years Exam Location:  [facility] Procedure:      VAS [place] LOWER EXTREMITY VENOUS (DVT) Referring Phys: [place] --------------------------------------------------------------------------------  Indications: Swelling, and Syncope.  Limitations: Edema. Comparison Study: Prior negative left LEV done 12/03/13 Performing  Technologist: Sharion Dove RVS  Examination Guidelines: A complete evaluation includes B-mode imaging, spectral Doppler, color Doppler, and power Doppler as needed of all accessible portions of each vessel. Bilateral testing is considered an integral part of a complete examination. Limited examinations for reoccurring indications may be performed as noted. The reflux portion of the exam is performed with the patient in reverse Trendelenburg.  +---------+---------------+---------+-----------+----------+-------------------+ RIGHT    CompressibilityPhasicitySpontaneityPropertiesThrombus Aging      +---------+---------------+---------+-----------+----------+-------------------+ CFV                     Yes      Yes                  patent by color and                                                       Doppler             +---------+---------------+---------+-----------+----------+-------------------+ FV Prox                 Yes      Yes  patent by color and                                                       Doppler             +---------+---------------+---------+-----------+----------+-------------------+ FV Mid   Full                                                             +---------+---------------+---------+-----------+----------+-------------------+ FV DistalFull                                                             +---------+---------------+---------+-----------+----------+-------------------+ PFV                                                   Not well visualized +---------+---------------+---------+-----------+----------+-------------------+ POP      Full           Yes      Yes                                      +---------+---------------+---------+-----------+----------+-------------------+ PTV      Full                                                              +---------+---------------+---------+-----------+----------+-------------------+ PERO     Full                                                             +---------+---------------+---------+-----------+----------+-------------------+   +---------+---------------+---------+-----------+----------+-------------------+ LEFT     CompressibilityPhasicitySpontaneityPropertiesThrombus Aging      +---------+---------------+---------+-----------+----------+-------------------+ CFV                     Yes      Yes                  patent by color and                                                       Doppler             +---------+---------------+---------+-----------+----------+-------------------+ FV Prox  Yes      Yes                  patent by color and                                                       Doppler             +---------+---------------+---------+-----------+----------+-------------------+ FV Mid   Full                                                             +---------+---------------+---------+-----------+----------+-------------------+ FV DistalFull                                                             +---------+---------------+---------+-----------+----------+-------------------+ PFV                                                   Not well visualized +---------+---------------+---------+-----------+----------+-------------------+ POP      Full           Yes      Yes                                      +---------+---------------+---------+-----------+----------+-------------------+ PTV      Full                                                             +---------+---------------+---------+-----------+----------+-------------------+ PERO     Full                                                             +---------+---------------+---------+-----------+----------+-------------------+      Summary: BILATERAL: - No evidence of deep vein thrombosis seen in the lower extremities, bilaterally. -No evidence of popliteal cyst, bilaterally.   *See table(s) above for measurements and observations. Electronically signed by Jamelle Haring on 12/19/2021 at 2:44:42 PM.    Final    EEG adult  Result Date: 12/18/2021 Lora Havens, MD     12/18/2021  9:33 PM Patient Name: Kimberlyann Hollar MRN: [id number] Epilepsy Attending: Lora Havens Referring Physician/Provider: Antonieta Pert, MD Date: 12/18/2021 Duration: 26.04 mins Patient history: 61yo F presents to ED following syncopal episode and unresponsiveness at home while trying to use restroom. Level of alertness: Awake, asleep AEDs during EEG study: None Technical aspects:  This EEG study was done with scalp electrodes positioned according to the 10-20 International system of electrode placement. Electrical activity was reviewed with band pass filter of 1-'70Hz'$ , sensitivity of 7 uV/mm, display speed of 70m/sec with a '60Hz'$  notched filter applied as appropriate. EEG data were recorded continuously and digitally stored.  Video monitoring was available and reviewed as appropriate. Description: No clear posterior dominant rhythm consists of 9-10 Hz activity of moderate voltage (25-35 uV) seen predominantly in posterior head regions, symmetric and reactive to eye opening and eye closing. Sleep was characterized by vertex waves, sleep spindles (12 to 14 Hz), maximal frontocentral region.  There is low amplitude 15 to 18 Hz beta activity distributed symmetrically and diffusely. Physiologic photic driving was not seen during photic stimulation.  Hyperventilation was not performed.   IMPRESSION: This study is within normal limits. No seizures or epileptiform discharges were seen throughout the recording. A normal interictal EEG does not exclude the diagnosis of epilepsy. PLora Havens  ECHOCARDIOGRAM COMPLETE  Result Date: 12/18/2021    ECHOCARDIOGRAM  REPORT   Patient Name:   DRuthene MethvinDate of Exam: 12/18/2021 Medical Rec #:  [id number]           Height:       69.0 in Accession #:    [id number]          Weight:       202.4 lb Date of Birth:  111/02/1960           BSA:          2.076 m Patient Age:    89years             BP:           108/71 mmHg Patient Gender: F                    HR:           72 bpm. Exam Location:  Inpatient Procedure: 2D Echo, Cardiac Doppler, Color Doppler and Intracardiac            Opacification Agent                            STAT ECHO Reported to: Dr MRudean Haskellon 12/18/2021 12:36:00 PM. Indications:    Syncope  History:        Patient has no prior history of Echocardiogram examinations.                 Risk Factors:Hypertension.  Sonographer:    AClayton LefortRDCS (AE) Referring Phys: JEtta QuillIMPRESSIONS  1. Left ventricular ejection fraction, by estimation, is 55 to 60%. The left ventricle has normal function. The left ventricle has no regional wall motion abnormalities. There is moderate asymmetric left ventricular hypertrophy of the septal segment. Left ventricular diastolic parameters are indeterminate.  2. Right ventricular systolic function is normal. The right ventricular size is normal. There is normal pulmonary artery systolic pressure. The estimated right ventricular systolic pressure is 277.8mmHg.  3. The mitral valve is abnormal. Mild mitral valve regurgitation. No evidence of mitral stenosis.  4. The aortic valve is tricuspid. Aortic valve regurgitation is not visualized.  5. The inferior vena cava is normal in size with <50% respiratory variability, suggesting right atrial pressure of 8 mmHg. Comparison(s): No prior Echocardiogram. FINDINGS  Left Ventricle: Left ventricular ejection fraction, by estimation, is 55  to 60%. The left ventricle has normal function. The left ventricle has no regional wall motion abnormalities. Definity contrast agent was given IV to delineate the left ventricular   endocardial borders. The left ventricular internal cavity size was normal in size. There is moderate asymmetric left ventricular hypertrophy of the septal segment. Left ventricular diastolic parameters are indeterminate. Right Ventricle: The right ventricular size is normal. No increase in right ventricular wall thickness. Right ventricular systolic function is normal. There is normal pulmonary artery systolic pressure. The tricuspid regurgitant velocity is 2.14 m/s, and  with an assumed right atrial pressure of 8 mmHg, the estimated right ventricular systolic pressure is 52.8 mmHg. Left Atrium: Left atrial size was normal in size. Right Atrium: Right atrial size was normal in size. Pericardium: There is no evidence of pericardial effusion. Mitral Valve: The mitral valve is abnormal. Mild mitral valve regurgitation. No evidence of mitral valve stenosis. Tricuspid Valve: The tricuspid valve is normal in structure. Tricuspid valve regurgitation is mild . No evidence of tricuspid stenosis. Aortic Valve: The aortic valve is tricuspid. Aortic valve regurgitation is not visualized. Aortic valve mean gradient measures 4.0 mmHg. Aortic valve peak gradient measures 7.0 mmHg. Aortic valve area, by VTI measures 2.52 cm. Pulmonic Valve: The pulmonic valve was normal in structure. Pulmonic valve regurgitation is trivial. Aorta: The aortic root and ascending aorta are structurally normal, with no evidence of dilitation. Venous: The inferior vena cava is normal in size with less than 50% respiratory variability, suggesting right atrial pressure of 8 mmHg. IAS/Shunts: No atrial level shunt detected by color flow Doppler.  LEFT VENTRICLE PLAX 2D LVIDd:         4.50 cm LVIDs:         2.60 cm LV PW:         1.20 cm LV IVS:        1.40 cm LVOT diam:     2.20 cm LV SV:         68 LV SV Index:   33 LVOT Area:     3.80 cm  RIGHT VENTRICLE RV Basal diam:  3.00 cm RV S prime:     10.10 cm/s TAPSE (M-mode): 2.5 cm LEFT ATRIUM              Index        RIGHT ATRIUM           Index LA diam:        3.20 cm 1.54 cm/m   RA Area:     14.70 cm LA Vol (A2C):   47.5 ml 22.88 ml/m  RA Volume:   37.90 ml  18.25 ml/m LA Vol (A4C):   51.0 ml 24.56 ml/m LA Biplane Vol: 50.3 ml 24.22 ml/m  AORTIC VALVE AV Area (Vmax):    2.41 cm AV Area (Vmean):   2.41 cm AV Area (VTI):     2.52 cm AV Vmax:           132.00 cm/s AV Vmean:          89.100 cm/s AV VTI:            0.272 m AV Peak Grad:      7.0 mmHg AV Mean Grad:      4.0 mmHg LVOT Vmax:         83.80 cm/s LVOT Vmean:        56.600 cm/s LVOT VTI:          0.180 m LVOT/AV VTI ratio:  0.71  AORTA Ao Root diam: 2.80 cm Ao Asc diam:  3.20 cm MITRAL VALVE               TRICUSPID VALVE MV Area (PHT): 2.90 cm    TR Peak grad:   18.3 mmHg MV Decel Time: 262 msec    TR Vmax:        214.00 cm/s MV E velocity: 89.60 cm/s                            SHUNTS                            Systemic VTI:  0.18 m                            Systemic Diam: 2.20 cm Rudean Haskell MD Electronically signed by Rudean Haskell MD Signature Date/Time: 12/18/2021/1:02:47 PM    Final    NM Pulmonary Perfusion  Result Date: 12/18/2021 CLINICAL DATA:  Shortness of breath.  Positive D-dimer. EXAM: NUCLEAR MEDICINE PERFUSION LUNG SCAN TECHNIQUE: Perfusion images were obtained in multiple projections after intravenous injection of radiopharmaceutical. Ventilation scans intentionally deferred if perfusion scan and chest x-ray adequate for interpretation during COVID 19 epidemic. RADIOPHARMACEUTICALS:  For mCi Tc-6mMAA IV COMPARISON:  Chest or x-ray earlier same day FINDINGS: No segmental peripheral perfusion defect in either lung. IMPRESSION: Negative for pulmonary embolus. Electronically Signed   By: EMisty StanleyM.D.   On: 12/18/2021 11:34   CT Renal Stone Study  Result Date: 12/18/2021 CLINICAL DATA:  Syncopal episode. EXAM: CT ABDOMEN AND PELVIS WITHOUT CONTRAST TECHNIQUE: Multidetector CT imaging of the abdomen and  pelvis was performed following the standard protocol without IV contrast. RADIATION DOSE REDUCTION: This exam was performed according to the departmental dose-optimization program which includes automated exposure control, adjustment of the mA and/or kV according to patient size and/or use of iterative reconstruction technique. COMPARISON:  None Available. FINDINGS: Lower chest: Mild atelectasis is seen within the bilateral lung bases. Hepatobiliary: No focal liver abnormality is seen. No gallstones, gallbladder wall thickening, or biliary dilatation. Pancreas: Unremarkable. No pancreatic ductal dilatation or surrounding inflammatory changes. Spleen: Normal in size without focal abnormality. Adrenals/Urinary Tract: Adrenal glands are unremarkable. The kidneys are slightly small in size., without renal calculi, focal lesion, or hydronephrosis. Bladder is unremarkable. Stomach/Bowel: There is a small hiatal hernia. Appendix appears normal. Stool is seen throughout the large bowel. This is most prominent within the distal sigmoid colon and rectum. No evidence of bowel wall thickening, distention, or inflammatory changes. Noninflamed diverticula are seen throughout the ascending colon. Vascular/Lymphatic: No significant vascular findings are present. No enlarged abdominal or pelvic lymph nodes. Reproductive: Status post hysterectomy. No adnexal masses. Other: A 1.9 cm x 1.2 cm fat-containing umbilical hernia is noted. No abdominopelvic ascites. Musculoskeletal: Marked severity multilevel degenerative changes are seen throughout the lumbar spine. IMPRESSION: 1. Small hiatal hernia. 2. Colonic diverticulosis. 3. Large stool burden with impacted stool noted within the distal sigmoid colon and rectum. Electronically Signed   By: TVirgina NorfolkM.D.   On: 12/18/2021 01:49   CT Head Wo Contrast  Result Date: 12/18/2021 CLINICAL DATA:  Syncopal episode EXAM: CT HEAD WITHOUT CONTRAST TECHNIQUE: Contiguous axial images  were obtained from the base of the skull through the vertex without intravenous contrast. RADIATION DOSE REDUCTION: This exam was performed  according to the departmental dose-optimization program which includes automated exposure control, adjustment of the mA and/or kV according to patient size and/or use of iterative reconstruction technique. COMPARISON:  04/10/2019 FINDINGS: Brain: No evidence of acute infarction, hemorrhage, mass, mass effect, or midline shift. No hydrocephalus or extra-axial fluid collection. Vascular: No hyperdense vessel. Skull: Normal. Negative for fracture or focal lesion. Sinuses/Orbits: No acute finding. Other: The mastoid air cells are well aerated. IMPRESSION: No acute intracranial process. Electronically Signed   By: Merilyn Baba M.D.   On: 12/18/2021 01:34   DG Chest Port 1 View  Result Date: 12/18/2021 CLINICAL DATA:  Possible sepsis.  Loss of consciousness. EXAM: PORTABLE CHEST 1 VIEW COMPARISON:  04/10/2019. FINDINGS: The heart size and mediastinal contours are stable. Lung volumes are low. No consolidation, effusion, or pneumothorax. No acute osseous abnormality. IMPRESSION: No active disease. Electronically Signed   By: Brett Fairy M.D.   On: 12/18/2021 00:16    Microbiology: Results for orders placed or performed during the hospital encounter of 12/17/21  Resp Panel by RT-PCR (Flu A&B, Covid) Anterior Nasal Swab     Status: None   Collection Time: 12/17/21 11:04 PM   Specimen: Anterior Nasal Swab  Result Value Ref Range Status   SARS Coronavirus 2 by RT PCR NEGATIVE NEGATIVE Final    Comment: (NOTE) SARS-CoV-2 target nucleic acids are NOT DETECTED.  The SARS-CoV-2 RNA is generally detectable in upper respiratory specimens during the acute phase of infection. The lowest concentration of SARS-CoV-2 viral copies this assay can detect is 138 copies/mL. A negative result does not preclude SARS-Cov-2 infection and should not be used as the sole basis for  treatment or other patient management decisions. A negative result may occur with  improper specimen collection/handling, submission of specimen other than nasopharyngeal swab, presence of viral mutation(s) within the areas targeted by this assay, and inadequate number of viral copies(<138 copies/mL). A negative result must be combined with clinical observations, patient history, and epidemiological information. The expected result is Negative.  Fact Sheet for Patients:  EntrepreneurPulse.com.au  Fact Sheet for Healthcare Providers:  IncredibleEmployment.be  This test is no t yet approved or cleared by the [place] FDA and  has been authorized for detection and/or diagnosis of SARS-CoV-2 by FDA under an Emergency Use Authorization (EUA). This EUA will remain  in effect (meaning this test can be used) for the duration of the COVID-19 declaration under Section 564(b)(1) of the Act, 21 U.S.C.section 360bbb-3(b)(1), unless the authorization is terminated  or revoked sooner.       Influenza A by PCR NEGATIVE NEGATIVE Final   Influenza B by PCR NEGATIVE NEGATIVE Final    Comment: (NOTE) The Xpert Xpress SARS-CoV-2/FLU/RSV plus assay is intended as an aid in the diagnosis of influenza from Nasopharyngeal swab specimens and should not be used as a sole basis for treatment. Nasal washings and aspirates are unacceptable for Xpert Xpress SARS-CoV-2/FLU/RSV testing.  Fact Sheet for Patients: EntrepreneurPulse.com.au  Fact Sheet for Healthcare Providers: IncredibleEmployment.be  This test is not yet approved or cleared by the [place] FDA and has been authorized for detection and/or diagnosis of SARS-CoV-2 by FDA under an Emergency Use Authorization (EUA). This EUA will remain in effect (meaning this test can be used) for the duration of the COVID-19 declaration under Section 564(b)(1) of the Act, 21  U.S.C. section 360bbb-3(b)(1), unless the authorization is terminated or revoked.  Performed at [facility], [place] [address], [place],  [address]  Blood Culture (routine x 2)     Status: None (Preliminary result)   Collection Time: 12/17/21 11:57 PM   Specimen: BLOOD  Result Value Ref Range Status   Specimen Description BLOOD RIGHT ANTECUBITAL  Final   Special Requests   Final    BOTTLES DRAWN AEROBIC AND ANAEROBIC Blood Culture results may not be optimal due to an excessive volume of blood received in culture bottles   Culture   Final    NO GROWTH 2 DAYS Performed at [facility], [place] [address] [place], [place] [address]    Report Status PENDING  Incomplete  Blood Culture (routine x 2)     Status: None (Preliminary result)   Collection Time: 12/18/21 12:05 AM   Specimen: BLOOD LEFT HAND  Result Value Ref Range Status   Specimen Description BLOOD LEFT HAND  Final   Special Requests   Final    BOTTLES DRAWN AEROBIC ONLY Blood Culture adequate volume   Culture   Final    NO GROWTH 2 DAYS Performed at [facility], [place] [address] [place], [place] [address]    Report Status PENDING  Incomplete  Urine Culture     Status: None   Collection Time: 12/18/21  5:39 AM   Specimen: In/Out Cath Urine  Result Value Ref Range Status   Specimen Description IN/OUT CATH URINE  Final   Special Requests Normal  Final   Culture   Final    NO GROWTH Performed at [facility], [place] [address], [place], [place] [address]    Report Status 12/19/2021 FINAL  Final    Labs: CBC: Recent Labs  Lab 12/17/21 2304 12/18/21 0348 12/18/21 1418 12/18/21 1802 12/19/21 0255 12/19/21 1026 12/19/21 1550 12/20/21 0744  WBC 10.7* 15.3*  --   --   --  11.8*  --  10.8*  NEUTROABS 5.7  --   --   --   --   --   --  7.1  HGB 13.4 13.8   < > 13.0 11.9* 11.7* 12.5 11.5*  HCT 38.5 41.0   < > 36.9 34.9* 34.2* 35.2* 33.6*  MCV 95.5 95.6  --   --   --  96.3  --  96.0  PLT 282  249  --   --   --  217  --  222   < > = values in this interval not displayed.   Basic Metabolic Panel: Recent Labs  Lab 12/17/21 2304 12/18/21 0348 12/19/21 1026 12/20/21 0744  NA 140 140 142 141  K 3.2* 3.3* 3.6 3.8  CL 105 105 109 111  CO2 '23 25 24 24  '$ GLUCOSE 178* 126* 106* 88  BUN 32* 30* 13 11  CREATININE 1.92* 1.74* 1.61* 1.29*  CALCIUM 9.4 9.4 8.9 9.3  MG  --  1.7  --   --    Liver Function Tests: Recent Labs  Lab 12/17/21 2304 12/19/21 1026  AST 38 47*  ALT 25 40  ALKPHOS 71 61  BILITOT 0.4 0.4  PROT 7.0 6.1*  ALBUMIN 3.4* 2.9*   CBG: Recent Labs  Lab 12/17/21 2304 12/18/21 0827 12/19/21 0656  GLUCAP 181* 147* 106*    Discharge time spent: greater than 30 minutes.  Signed: Alma Friendly, MD [facility] 12/20/2021

## 2021-12-20 NOTE — Progress Notes (Addendum)
Rounding Note    Patient Name: Christine Salazar Date of Encounter: 12/20/2021  [place] Cardiologist: Minus Breeding, MD   Subjective   Pt feels well, working with therapy and getting another set of orthostatic vitals when I entered the room. No complaints, she doesn't drive.  Inpatient Medications    Scheduled Meds:  docusate sodium  100 mg Oral BID   senna  1 tablet Oral BID   sodium chloride flush  3 mL Intravenous Q12H   Continuous Infusions:  sodium chloride 50 mL/hr at 12/20/21 0259   PRN Meds: acetaminophen **OR** acetaminophen, bisacodyl, ondansetron **OR** ondansetron (ZOFRAN) IV   Vital Signs    Vitals:   12/19/21 1628 12/19/21 2105 12/20/21 0602 12/20/21 0920  BP: 122/73 130/82 118/62 115/77  Pulse: 73 72 75 77  Resp: '18 19 18 18  '$ Temp: 98.1 F (36.7 C) 98.1 F (36.7 C) 98.2 F (36.8 C) 97.7 F (36.5 C)  TempSrc: Oral Oral Oral Oral  SpO2: 98% 98% 94% 98%  Weight:   91.7 kg   Height:        Intake/Output Summary (Last 24 hours) at 12/20/2021 1036 Last data filed at 12/20/2021 0900 Gross per 24 hour  Intake 2257.42 ml  Output --  Net 2257.42 ml      12/20/2021    6:02 AM 12/18/2021    6:48 AM 04/10/2019    4:50 PM  Last 3 Weights  Weight (lbs) 202 lb 2.6 oz 202 lb 6.1 oz 222 lb  Weight (kg) 91.7 kg 91.8 kg 100.699 kg      Telemetry    Sinus rhythm with first degree heart block, PVCs and episode of ventricular bigeminy - Personally Reviewed  ECG    No new tracings - Personally Reviewed  Physical Exam   GEN: No acute distress.   Neck: No JVD Cardiac: RRR, no murmurs, rubs, or gallops.  Respiratory: Clear to auscultation bilaterally. GI: Soft, nontender, non-distended  MS: No edema; No deformity. Neuro:  Nonfocal  Psych: Normal affect   Labs    High Sensitivity Troponin:   Recent Labs  Lab 12/17/21 2304 12/18/21 0210 12/18/21 0348  TROPONINIHS '7 6 6     '$ Chemistry Recent Labs  Lab 12/17/21 2304  12/18/21 0348 12/19/21 1026 12/20/21 0744  NA 140 140 142 141  K 3.2* 3.3* 3.6 3.8  CL 105 105 109 111  CO2 '23 25 24 24  '$ GLUCOSE 178* 126* 106* 88  BUN 32* 30* 13 11  CREATININE 1.92* 1.74* 1.61* 1.29*  CALCIUM 9.4 9.4 8.9 9.3  MG  --  1.7  --   --   PROT 7.0  --  6.1*  --   ALBUMIN 3.4*  --  2.9*  --   AST 38  --  47*  --   ALT 25  --  40  --   ALKPHOS 71  --  61  --   BILITOT 0.4  --  0.4  --   GFRNONAA 29* 33* 36* 48*  ANIONGAP '12 10 9 6    '$ Lipids No results for input(s): "CHOL", "TRIG", "HDL", "LABVLDL", "LDLCALC", "CHOLHDL" in the last 168 hours.  Hematology Recent Labs  Lab 12/18/21 0348 12/18/21 1418 12/19/21 1026 12/19/21 1550 12/20/21 0744  WBC 15.3*  --  11.8*  --  10.8*  RBC 4.29  --  3.55*  --  3.50*  HGB 13.8   < > 11.7* 12.5 11.5*  HCT 41.0   < >  34.2* 35.2* 33.6*  MCV 95.6  --  96.3  --  96.0  MCH 32.2  --  33.0  --  32.9  MCHC 33.7  --  34.2  --  34.2  RDW 12.2  --  12.3  --  12.6  PLT 249  --  217  --  222   < > = values in this interval not displayed.   Thyroid  Recent Labs  Lab 12/18/21 0348  TSH 1.249    BNPNo results for input(s): "BNP", "PROBNP" in the last 168 hours.  DDimer  Recent Labs  Lab 12/18/21 0348 12/18/21 1418  DDIMER >20.00* 15.96*     Radiology    VAS [place] LOWER EXTREMITY VENOUS (DVT)  Result Date: 12/19/2021  Lower Venous DVT Study Patient Name:  Christine Salazar  Date of Exam:   12/18/2021 Medical Rec #: [id number]             Accession #:    [id number] Date of Birth: 03-01-1960             Patient Gender: F Patient Age:   61 years Exam Location:  [facility] Procedure:      VAS [place] LOWER EXTREMITY VENOUS (DVT) Referring Phys: [place] KC --------------------------------------------------------------------------------  Indications: Swelling, and Syncope.  Limitations: Edema. Comparison Study: Prior negative left LEV done 12/03/13 Performing Technologist: Sharion Dove RVS  Examination Guidelines: A complete  evaluation includes B-mode imaging, spectral Doppler, color Doppler, and power Doppler as needed of all accessible portions of each vessel. Bilateral testing is considered an integral part of a complete examination. Limited examinations for reoccurring indications may be performed as noted. The reflux portion of the exam is performed with the patient in reverse Trendelenburg.  +---------+---------------+---------+-----------+----------+-------------------+ RIGHT    CompressibilityPhasicitySpontaneityPropertiesThrombus Aging      +---------+---------------+---------+-----------+----------+-------------------+ CFV                     Yes      Yes                  patent by color and                                                       Doppler             +---------+---------------+---------+-----------+----------+-------------------+ FV Prox                 Yes      Yes                  patent by color and                                                       Doppler             +---------+---------------+---------+-----------+----------+-------------------+ FV Mid   Full                                                             +---------+---------------+---------+-----------+----------+-------------------+  FV DistalFull                                                             +---------+---------------+---------+-----------+----------+-------------------+ PFV                                                   Not well visualized +---------+---------------+---------+-----------+----------+-------------------+ POP      Full           Yes      Yes                                      +---------+---------------+---------+-----------+----------+-------------------+ PTV      Full                                                             +---------+---------------+---------+-----------+----------+-------------------+ PERO     Full                                                              +---------+---------------+---------+-----------+----------+-------------------+   +---------+---------------+---------+-----------+----------+-------------------+ LEFT     CompressibilityPhasicitySpontaneityPropertiesThrombus Aging      +---------+---------------+---------+-----------+----------+-------------------+ CFV                     Yes      Yes                  patent by color and                                                       Doppler             +---------+---------------+---------+-----------+----------+-------------------+ FV Prox                 Yes      Yes                  patent by color and                                                       Doppler             +---------+---------------+---------+-----------+----------+-------------------+ FV Mid   Full                                                             +---------+---------------+---------+-----------+----------+-------------------+  FV DistalFull                                                             +---------+---------------+---------+-----------+----------+-------------------+ PFV                                                   Not well visualized +---------+---------------+---------+-----------+----------+-------------------+ POP      Full           Yes      Yes                                      +---------+---------------+---------+-----------+----------+-------------------+ PTV      Full                                                             +---------+---------------+---------+-----------+----------+-------------------+ PERO     Full                                                             +---------+---------------+---------+-----------+----------+-------------------+     Summary: BILATERAL: - No evidence of deep vein thrombosis seen in the lower extremities, bilaterally. -No  evidence of popliteal cyst, bilaterally.   *See table(s) above for measurements and observations. Electronically signed by Jamelle Haring on 12/19/2021 at 2:44:42 PM.    Final    EEG adult  Result Date: 12/18/2021 Lora Havens, MD     12/18/2021  9:33 PM Patient Name: Christine Salazar MRN: [id number] Epilepsy Attending: Lora Havens Referring Physician/Provider: Antonieta Pert, MD Date: 12/18/2021 Duration: 26.04 mins Patient history: 61yo F presents to ED following syncopal episode and unresponsiveness at home while trying to use restroom. Level of alertness: Awake, asleep AEDs during EEG study: None Technical aspects: This EEG study was done with scalp electrodes positioned according to the 10-20 International system of electrode placement. Electrical activity was reviewed with band pass filter of 1-'70Hz'$ , sensitivity of 7 uV/mm, display speed of 71m/sec with a '60Hz'$  notched filter applied as appropriate. EEG data were recorded continuously and digitally stored.  Video monitoring was available and reviewed as appropriate. Description: No clear posterior dominant rhythm consists of 9-10 Hz activity of moderate voltage (25-35 uV) seen predominantly in posterior head regions, symmetric and reactive to eye opening and eye closing. Sleep was characterized by vertex waves, sleep spindles (12 to 14 Hz), maximal frontocentral region.  There is low amplitude 15 to 18 Hz beta activity distributed symmetrically and diffusely. Physiologic photic driving was not seen during photic stimulation.  Hyperventilation was not performed.   IMPRESSION: This study is within normal limits. No seizures or epileptiform discharges were seen throughout the recording. A normal interictal EEG does not exclude  the diagnosis of epilepsy. Lora Havens   ECHOCARDIOGRAM COMPLETE  Result Date: 12/18/2021    ECHOCARDIOGRAM REPORT   Patient Name:   Christine Salazar Date of Exam: 12/18/2021 Medical Rec #:  [id number]             Height:       69.0 in Accession #:    [id number]           Weight:       202.4 lb Date of Birth:  Jun 30, 1960            BSA:          2.076 m Patient Age:    61 years             BP:           108/71 mmHg Patient Gender: F                    HR:           72 bpm. Exam Location:  Inpatient Procedure: 2D Echo, Cardiac Doppler, Color Doppler and Intracardiac            Opacification Agent                            STAT ECHO Reported to: Dr Rudean Haskell on 12/18/2021 12:36:00 PM. Indications:    Syncope  History:        Patient has no prior history of Echocardiogram examinations.                 Risk Factors:Hypertension.  Sonographer:    Clayton Lefort RDCS (AE) Referring Phys: Etta Quill IMPRESSIONS  1. Left ventricular ejection fraction, by estimation, is 55 to 60%. The left ventricle has normal function. The left ventricle has no regional wall motion abnormalities. There is moderate asymmetric left ventricular hypertrophy of the septal segment. Left ventricular diastolic parameters are indeterminate.  2. Right ventricular systolic function is normal. The right ventricular size is normal. There is normal pulmonary artery systolic pressure. The estimated right ventricular systolic pressure is 89.2 mmHg.  3. The mitral valve is abnormal. Mild mitral valve regurgitation. No evidence of mitral stenosis.  4. The aortic valve is tricuspid. Aortic valve regurgitation is not visualized.  5. The inferior vena cava is normal in size with <50% respiratory variability, suggesting right atrial pressure of 8 mmHg. Comparison(s): No prior Echocardiogram. FINDINGS  Left Ventricle: Left ventricular ejection fraction, by estimation, is 55 to 60%. The left ventricle has normal function. The left ventricle has no regional wall motion abnormalities. Definity contrast agent was given IV to delineate the left ventricular  endocardial borders. The left ventricular internal cavity size was normal in size. There is moderate  asymmetric left ventricular hypertrophy of the septal segment. Left ventricular diastolic parameters are indeterminate. Right Ventricle: The right ventricular size is normal. No increase in right ventricular wall thickness. Right ventricular systolic function is normal. There is normal pulmonary artery systolic pressure. The tricuspid regurgitant velocity is 2.14 m/s, and  with an assumed right atrial pressure of 8 mmHg, the estimated right ventricular systolic pressure is 11.9 mmHg. Left Atrium: Left atrial size was normal in size. Right Atrium: Right atrial size was normal in size. Pericardium: There is no evidence of pericardial effusion. Mitral Valve: The mitral valve is abnormal. Mild mitral valve regurgitation. No evidence of mitral valve stenosis. Tricuspid Valve: The tricuspid valve  is normal in structure. Tricuspid valve regurgitation is mild . No evidence of tricuspid stenosis. Aortic Valve: The aortic valve is tricuspid. Aortic valve regurgitation is not visualized. Aortic valve mean gradient measures 4.0 mmHg. Aortic valve peak gradient measures 7.0 mmHg. Aortic valve area, by VTI measures 2.52 cm. Pulmonic Valve: The pulmonic valve was normal in structure. Pulmonic valve regurgitation is trivial. Aorta: The aortic root and ascending aorta are structurally normal, with no evidence of dilitation. Venous: The inferior vena cava is normal in size with less than 50% respiratory variability, suggesting right atrial pressure of 8 mmHg. IAS/Shunts: No atrial level shunt detected by color flow Doppler.  LEFT VENTRICLE PLAX 2D LVIDd:         4.50 cm LVIDs:         2.60 cm LV PW:         1.20 cm LV IVS:        1.40 cm LVOT diam:     2.20 cm LV SV:         68 LV SV Index:   33 LVOT Area:     3.80 cm  RIGHT VENTRICLE RV Basal diam:  3.00 cm RV S prime:     10.10 cm/s TAPSE (M-mode): 2.5 cm LEFT ATRIUM             Index        RIGHT ATRIUM           Index LA diam:        3.20 cm 1.54 cm/m   RA Area:     14.70 cm  LA Vol (A2C):   47.5 ml 22.88 ml/m  RA Volume:   37.90 ml  18.25 ml/m LA Vol (A4C):   51.0 ml 24.56 ml/m LA Biplane Vol: 50.3 ml 24.22 ml/m  AORTIC VALVE AV Area (Vmax):    2.41 cm AV Area (Vmean):   2.41 cm AV Area (VTI):     2.52 cm AV Vmax:           132.00 cm/s AV Vmean:          89.100 cm/s AV VTI:            0.272 m AV Peak Grad:      7.0 mmHg AV Mean Grad:      4.0 mmHg LVOT Vmax:         83.80 cm/s LVOT Vmean:        56.600 cm/s LVOT VTI:          0.180 m LVOT/AV VTI ratio: 0.66  AORTA Ao Root diam: 2.80 cm Ao Asc diam:  3.20 cm MITRAL VALVE               TRICUSPID VALVE MV Area (PHT): 2.90 cm    TR Peak grad:   18.3 mmHg MV Decel Time: 262 msec    TR Vmax:        214.00 cm/s MV E velocity: 89.60 cm/s                            SHUNTS                            Systemic VTI:  0.18 m                            Systemic Diam: 2.20 cm  Rudean Haskell MD Electronically signed by Rudean Haskell MD Signature Date/Time: 12/18/2021/1:02:47 PM    Final    NM Pulmonary Perfusion  Result Date: 12/18/2021 CLINICAL DATA:  Shortness of breath.  Positive D-dimer. EXAM: NUCLEAR MEDICINE PERFUSION LUNG SCAN TECHNIQUE: Perfusion images were obtained in multiple projections after intravenous injection of radiopharmaceutical. Ventilation scans intentionally deferred if perfusion scan and chest x-ray adequate for interpretation during COVID 19 epidemic. RADIOPHARMACEUTICALS:  For mCi Tc-24mMAA IV COMPARISON:  Chest or x-ray earlier same day FINDINGS: No segmental peripheral perfusion defect in either lung. IMPRESSION: Negative for pulmonary embolus. Electronically Signed   By: EMisty StanleyM.D.   On: 12/18/2021 11:34    Cardiac Studies   Echo 12/18/21: 1. Left ventricular ejection fraction, by estimation, is 55 to 60%. The  left ventricle has normal function. The left ventricle has no regional  wall motion abnormalities. There is moderate asymmetric left ventricular  hypertrophy of the septal  segment.  Left ventricular diastolic parameters are indeterminate.   2. Right ventricular systolic function is normal. The right ventricular  size is normal. There is normal pulmonary artery systolic pressure. The  estimated right ventricular systolic pressure is 285.4mmHg.   3. The mitral valve is abnormal. Mild mitral valve regurgitation. No  evidence of mitral stenosis.   4. The aortic valve is tricuspid. Aortic valve regurgitation is not  visualized.   5. The inferior vena cava is normal in size with <50% respiratory  variability, suggesting right atrial pressure of 8 mmHg.   Patient Profile     61y.o. female with hx of syncope in the setting of GI bleed, RBBB, first degree AV block and HTN being seen for syncope in the setting of GI bleed..  Assessment & Plan    Syncope First degree heart block RBBB GI bleeding Given her conduction disease, Dr. KCaryl Comes(EP) saw her yesterday and did not think her first degree AV block or RBBB was responsible for her syncope. Suspect a noncardiac cause given normal HR. Orthostatic vitals normal again today. GI consulted for GI bleed   I did discuss the [place] DMV medical guidelines for driving: "it is prudent to recommend that all persons should be free of syncopal episodes for at least six months to be granted the driving privilege." ([place] Second Edition, Medical Review Branch, [place] site Division of [employer], July 2004)  Pt confirmed she does not drive.   AoCKD - improving Elevated D-dimer D dimer > 20k VQ scan negative for PE Lower extremity dopplers negative for DVT   For questions or updates, please contact CHoracePlease consult [email or web address] for contact info under        Signed, ALedora Bottcher PA  12/20/2021, 10:36 AM    Patient seen and examined   I agree with findings as noted above by  A [facility]  Pt is comfortable in chair  Not dizzy Lungs are CTA Cardiac RRR  No S3 Ext are without edema  Syncope.   Pt eval by EP  Do not feel arrhythmogenic event was a cause for syncope  Spell occurred when getting up for toilet   no straining prior     had one prior event in her past  that was similar   Probably vasomotor  Not unreasonable to set up for an event monitor to follow at home     OK to d/c from cardiac  standpoint. PT does not drive.  Will sign off.  Dorris Carnes MD

## 2021-12-20 NOTE — Evaluation (Signed)
Physical Therapy Evaluation Patient Details Name: Christine Salazar MRN: [id number] DOB: 1960/07/23 Today's Date: 12/20/2021  History of Present Illness  Admitted after syncopal episode, likely vasovagal; Cardiology and GI consulted for workup in the setting of  Clinical Impression  Pt admitted with above diagnosis. Lives at home with her brother, in a single-level home with 7 steps to enter; Prior to admission, pt was managing independently, enjoys being outside; Presents to PT overall moving well, with Orthostatic vitals showing a normal response to increased upright activity; Walked the hallways pushing IV pole without difficulty; Cues to self-monitor for activity tolerance; Anticipate she will meet PT goals next session;  Pt currently with functional limitations due to the deficits listed below (see PT Problem List). Pt will benefit from skilled PT to increase their independence and safety with mobility to allow discharge to the venue listed below.          Recommendations for follow up therapy are one component of a multi-disciplinary discharge planning process, led by the attending physician.  Recommendations may be updated based on patient status, additional functional criteria and insurance authorization.  Follow Up Recommendations No PT follow up      Assistance Recommended at Discharge PRN  Patient can return home with the following  Assist for transportation    Equipment Recommendations None recommended by PT  Recommendations for Other Services       Functional Status Assessment Patient has had a recent decline in their functional status and demonstrates the ability to make significant improvements in function in a reasonable and predictable amount of time.     Precautions / Restrictions Precautions Precautions: None Precaution Comments: Fall risk present, given syncopal episode, but minimal Restrictions Weight Bearing Restrictions: No      Mobility  Bed  Mobility Overal bed mobility: Modified Independent             General bed mobility comments: incr time    Transfers Overall transfer level: Modified independent Equipment used: None               General transfer comment: Slow rise, but no overt difficulty    Ambulation/Gait Ambulation/Gait assistance: Supervision Gait Distance (Feet): 300 Feet Assistive device: None, IV Pole Gait Pattern/deviations: Step-through pattern       General Gait Details: Cues to self-monitor for activity tolerance   Stairs            Wheelchair Mobility    Modified Rankin (Stroke Patients Only)       Balance Overall balance assessment: No apparent balance deficits (not formally assessed)                                           Pertinent Vitals/Pain Pain Assessment Pain Assessment: No/denies pain    Home Living Family/patient expects to be discharged to:: Private residence Living Arrangements: Other relatives (lives with her brother) Available Help at Discharge: Available 24 hours/day Type of Home: House Home Access: Stairs to enter Entrance Stairs-Rails: Psychiatric nurse of Steps: 7   Home Layout: One level Home Equipment: Conservation officer, nature (2 wheels);Shower seat - built in;Grab bars - tub/shower      Prior Function Prior Level of Function : Independent/Modified Independent             Mobility Comments: walked independently ADLs Comments: independent with mobility and selfcare     Hand Dominance  Dominant Hand: Right    Extremity/Trunk Assessment   Upper Extremity Assessment Upper Extremity Assessment: Overall WFL for tasks assessed    Lower Extremity Assessment Lower Extremity Assessment: Overall WFL for tasks assessed    Cervical / Trunk Assessment Cervical / Trunk Assessment: Normal  Communication   Communication: HOH (has hearing aides)  Cognition Arousal/Alertness: Awake/alert Behavior During  Therapy: WFL for tasks assessed/performed Overall Cognitive Status: Within Functional Limits for tasks assessed                                          General Comments General comments (skin integrity, edema, etc.): No balance difficulty noted with simulated selfcare tasks.    Exercises     Assessment/Plan    PT Assessment Patient needs continued PT services  PT Problem List Decreased activity tolerance       PT Treatment Interventions Gait training;Stair training;Functional mobility training;Therapeutic activities;Therapeutic exercise;Patient/family education    PT Goals (Current goals can be found in the Care Plan section)  Acute Rehab PT Goals Patient Stated Goal: Home soon PT Goal Formulation: With patient Time For Goal Achievement: 12/27/21 Potential to Achieve Goals: Good    Frequency Min 3X/week (Anticipate will meet goals next session)     Co-evaluation               AM-PAC PT "6 Clicks" Mobility  Outcome Measure Help needed turning from your back to your side while in a flat bed without using bedrails?: None Help needed moving from lying on your back to sitting on the side of a flat bed without using bedrails?: None Help needed moving to and from a bed to a chair (including a wheelchair)?: None Help needed standing up from a chair using your arms (e.g., wheelchair or bedside chair)?: None Help needed to walk in hospital room?: A Little Help needed climbing 3-5 steps with a railing? : A Little 6 Click Score: 22    End of Session Equipment Utilized During Treatment: Gait belt Activity Tolerance: Patient tolerated treatment well Patient left: in chair;with call bell/phone within reach;with family/visitor present Nurse Communication: Mobility status PT Visit Diagnosis: Other abnormalities of gait and mobility (R26.89)    Time: 8119-1478 PT Time Calculation (min) (ACUTE ONLY): 30 min   Charges:   PT Evaluation $PT Eval Low  Complexity: 1 Low PT Treatments $Gait Training: 8-22 mins        Roney Marion, PT  Acute Rehabilitation Services Office [phone]   Colletta [place] 12/20/2021, 11:22 AM

## 2021-12-20 NOTE — Progress Notes (Signed)
30 day event monitor for syncope, to be read by Dr. Percival Spanish

## 2021-12-20 NOTE — Progress Notes (Signed)
DISCHARGE NOTE HOME Christine Salazar is discharged Home per MD order. Discussed prescriptions and follow up appointments with the patient. medication list explained in detail. Patient verbalized understanding. Heart/syncopy monitor will be mailed to her house and she is to call cardiologist for an appointment.   Skin clean, dry and intact without evidence of skin break down, no evidence of skin tears noted. IV catheter discontinued intact. Site without signs and symptoms of complications. Dressing and pressure applied. Pt denies pain at the site currently. No complaints noted.  Patient free of lines, drains, and wounds.   An After Visit Summary (AVS) was printed and given to the patient. Patient escorted via wheelchair, and discharged home via private auto.  Neomia Glass, RN

## 2021-12-20 NOTE — Progress Notes (Unsigned)
Patient ID: Christine Salazar, female   DOB: 06-13-1960, 61 y.o.   MRN: [id number] Patient enrolled for [employer] to ship a 30 day cardiac event monitor to her address on file.  Letter with instructions mailed to patient.  Dr. Percival Spanish to read.

## 2021-12-20 NOTE — Plan of Care (Signed)
  Problem: Education: Goal: Knowledge of condition and prescribed therapy will improve Outcome: Progressing   Problem: Physical Regulation: Goal: Complications related to the disease process, condition or treatment will be avoided or minimized Outcome: Progressing

## 2021-12-20 NOTE — Evaluation (Signed)
Occupational Therapy Evaluation Patient Details Name: Christine Salazar MRN: [id number] DOB: 04/06/1960 Today's Date: 12/20/2021   History of Present Illness Admitted after syncopal episode, likely vasovagal; Cardiology and GI consulted for workup in the setting of   Clinical Impression   Pt currently independent for simulated selfcare tasks as well as transfers to and from the toilet and over to the sink without an assistive device.  BP in supine with HOB at 39 degrees 117/66 with HR at 74 BPM.  BP in sitting at 139/67.  No dyspnea noted with activity. No further OT needs at this time.        Recommendations for follow up therapy are one component of a multi-disciplinary discharge planning process, led by the attending physician.  Recommendations may be updated based on patient status, additional functional criteria and insurance authorization.   Follow Up Recommendations  No OT follow up    Assistance Recommended at Discharge PRN  Patient can return home with the following Assist for transportation    Functional Status Assessment  Patient has not had a recent decline in their functional status  Equipment Recommendations  None recommended by OT       Precautions / Restrictions Precautions Precautions: None Precaution Comments: Fall risk present, given syncopal episode, but minimal Restrictions Weight Bearing Restrictions: No      Mobility Bed Mobility Overal bed mobility: Independent                  Transfers Overall transfer level: Independent Equipment used: None               General transfer comment: Pt able to complete sit to stand from the bed and toilet without any difficulty and ambulate to and from the bathroom and sink.  She also retrieved item from the floor without difficulty as well.      Balance Overall balance assessment: Independent                                         ADL either performed or assessed with  clinical judgement   ADL Overall ADL's : At baseline;Independent                                       General ADL Comments: Pt is currently independent for simulated selfcare tasks and functional transfers.     Vision Baseline Vision/History: 1 Wears glasses Ability to See in Adequate Light: 0 Adequate Patient Visual Report: No change from baseline Vision Assessment?: No apparent visual deficits     Perception Perception Perception: Not tested   Praxis Praxis Praxis: Not tested    Pertinent Vitals/Pain Pain Assessment Pain Assessment: No/denies pain     Hand Dominance Right   Extremity/Trunk Assessment Upper Extremity Assessment Upper Extremity Assessment: Overall WFL for tasks assessed   Lower Extremity Assessment Lower Extremity Assessment: Overall WFL for tasks assessed   Cervical / Trunk Assessment Cervical / Trunk Assessment: Normal   Communication Communication Communication: HOH (has hearing aides)   Cognition Arousal/Alertness: Awake/alert Behavior During Therapy: WFL for tasks assessed/performed Overall Cognitive Status: Within Functional Limits for tasks assessed  General Comments  No balance difficulty noted with simulated selfcare tasks.            Home Living Family/patient expects to be discharged to:: Private residence Living Arrangements: Other relatives (lives with her brother) Available Help at Discharge: Available 24 hours/day Type of Home: House Home Access: Stairs to enter [employer] of Steps: 7 Entrance Stairs-Rails: Right;Left Home Layout: One level     Bathroom Shower/Tub: Tub/shower unit;Curtain;Walk-in shower (she uses the tub shower in her room)   Bathroom Toilet: Standard Bathroom Accessibility: Yes   Home Equipment: Conservation officer, nature (2 wheels);Shower seat - built in;Grab bars - tub/shower          Prior Functioning/Environment Prior  Level of Function : Independent/Modified Independent             Mobility Comments: walked independently ADLs Comments: independent with mobility and selfcare         AM-PAC OT "6 Clicks" Daily Activity     Outcome Measure Help from another person eating meals?: None Help from another person taking care of personal grooming?: None Help from another person toileting, which includes using toliet, bedpan, or urinal?: None Help from another person bathing (including washing, rinsing, drying)?: None Help from another person to put on and taking off regular upper body clothing?: None Help from another person to put on and taking off regular lower body clothing?: None 6 Click Score: 24   End of Session Nurse Communication: Mobility status  Activity Tolerance: Patient tolerated treatment well Patient left: in bed;with call bell/phone within reach;with family/visitor present                   Time: 0321-2248 OT Time Calculation (min): 29 min Charges:  OT General Charges $OT Visit: 1 Visit OT Evaluation $OT Eval Low Complexity: 1 Low OT Treatments $Self Care/Home Management : 8-22 mins  Milas Schappell OTR/L 12/20/2021, 11:22 AM

## 2021-12-20 NOTE — TOC Transition Note (Signed)
Transition of Care [facility]) - CM/SW Discharge Note   Patient Details  Name: Christine Salazar MRN: [id number] Date of Birth: 02/17/1961  [facility] CM/SW Contact:  Bartholomew Crews, RN Phone Number: [phone] 12/20/2021, 3:49 PM   Clinical Narrative:     Chart reviewed. No TOC needs identified. Patient to transition home today.   Final next level of care: Home/Self Care     Patient Goals and CMS Choice        Discharge Placement                       Discharge Plan and Services                                     Social Determinants of Health (SDOH) Interventions     Readmission Risk Interventions     No data to display

## 2021-12-23 LAB — CULTURE, BLOOD (ROUTINE X 2)
Culture: NO GROWTH
Culture: NO GROWTH
Special Requests: ADEQUATE

## 2021-12-28 ENCOUNTER — Ambulatory Visit: Payer: Commercial Managed Care - HMO | Attending: Physician Assistant

## 2021-12-28 DIAGNOSIS — R55 Syncope and collapse: Secondary | ICD-10-CM

## 2022-01-31 NOTE — Progress Notes (Signed)
Cardiology Office Note:    Date:  02/04/2022   ID:  Christine Salazar, DOB 1961-02-02, MRN [id number]  PCP:  Christine Salazar, [place] Providers Cardiologist:  Christine Breeding, MD Cardiology APP:  Christine Bottcher, PA { Referring MD: Christine Lei, MD   Chief Complaint  Patient presents with   Follow-up    syncope    History of Present Illness:    Christine Salazar is a 61 y.o. female with no significant cardiac history.  She was hospitalized 12/10/2021 after an episode of syncope.  She has a known RBBB and first degree AV block. She has had prior episodes of syncope in the setting of GI bleeding. She was recently hospitalized after syncope and collapse. She went to the bathroom with some GI discomfort, became weak, stood up and then collapses hitting her head on the shower. BP by EMS was 69/42 with HR 77. She had persistent hypotension despite normal heart rate. Guac positive stool. EP was consulted and felt this event was vasomotor. Dr. Caryl Salazar did not feel the RBBB or significant first degree heart block was contributory.    Heart monitor was placed and did not show any high grade block, pauses, or arrhythmias.   She presents today for hospital follow up. She has had no further syncope. She is here with her brother. We reviewed her EKG and anemia in the hospital. She denies bleeding. I have asked her to follow up with Dr. Collene Salazar. With further questioning, she was under a great deal of stress due to her mother passing just 48 hr prior to her syncope. Christine Salazar took care of their mother.   Past Medical History:  Diagnosis Date   1st degree AV block    Hearing loss    Hypertension    Morton's neuroma of left foot    Right bundle branch block    Syncope     Past Surgical History:  Procedure Laterality Date   NEURECTOMY FOOT      Current Medications: Current Meds  Medication Sig   amLODipine (NORVASC) 5 MG tablet Take 5 mg by mouth daily.       Allergies:   Vancomycin   Social History   Socioeconomic History   Marital status: Married    Spouse name: Not on file   Number of children: Not on file   Years of education: Not on file   Highest education level: Not on file  Occupational History   Not on file  Tobacco Use   Smoking status: Never   Smokeless tobacco: Never  Substance and Sexual Activity   Alcohol use: Not on file   Drug use: Not on file   Sexual activity: Not on file  Other Topics Concern   Not on file  Social History Narrative   Lives with brother and mom who died with hospice two years ago.     Social Determinants of Health   Financial Resource Strain: Not on file  Food Insecurity: Not on file  Transportation Needs: Not on file  Physical Activity: Not on file  Stress: Not on file  Social Connections: Not on file     Family History: The patient's family history includes Aortic dissection in her mother; Stroke in her mother. There is no history of Hearing loss.  ROS:   Please see the history of present illness.     All other systems reviewed and are negative.  EKGs/Labs/Other Studies Reviewed:    The following  studies were reviewed today:  Echo 12/18/21: 1. Left ventricular ejection fraction, by estimation, is 55 to 60%. The  left ventricle has normal function. The left ventricle has no regional  wall motion abnormalities. There is moderate asymmetric left ventricular  hypertrophy of the septal segment.  Left ventricular diastolic parameters are indeterminate.   2. Right ventricular systolic function is normal. The right ventricular  size is normal. There is normal pulmonary artery systolic pressure. The  estimated right ventricular systolic pressure is 01.0 mmHg.   3. The mitral valve is abnormal. Mild mitral valve regurgitation. No  evidence of mitral stenosis.   4. The aortic valve is tricuspid. Aortic valve regurgitation is not  visualized.   5. The inferior vena cava is normal in  size with <50% respiratory  variability, suggesting right atrial pressure of 8 mmHg.     EKG:  EKG is not ordered today.    Recent Labs: 12/18/2021: Magnesium 1.7; TSH 1.249 12/19/2021: ALT 40 12/20/2021: BUN 11; Creatinine, Ser 1.29; Hemoglobin 11.5; Platelets 222; Potassium 3.8; Sodium 141  Recent Lipid Panel No results found for: "CHOL", "TRIG", "HDL", "CHOLHDL", "VLDL", "LDLCALC", "LDLDIRECT"   Risk Assessment/Calculations:                Physical Exam:    VS:  BP 136/86   Pulse 87   Ht '5\' 9"'$  (1.753 m)   Wt 200 lb (90.7 kg)   SpO2 97%   BMI 29.53 kg/m     Wt Readings from Last 3 Encounters:  02/04/22 200 lb (90.7 kg)  12/20/21 202 lb 2.6 oz (91.7 kg)  04/10/19 222 lb (100.7 kg)     GEN:  Well nourished, well developed in no acute distress HEENT: Normal NECK: No JVD; No carotid bruits LYMPHATICS: No lymphadenopathy CARDIAC: RRR, no murmurs, rubs, gallops RESPIRATORY:  Clear to auscultation without rales, wheezing or rhonchi  ABDOMEN: Soft, non-tender, non-distended MUSCULOSKELETAL:  No edema; No deformity  SKIN: Warm and dry NEUROLOGIC:  Alert and oriented x 3 PSYCHIATRIC:  Normal affect   ASSESSMENT:    1. Syncope and collapse   2. RBBB (right bundle branch block)   3. 1st degree AV block   4. Hemorrhoids, internal - Grade 1 only   5. Primary hypertension    PLAN:    In order of problems listed above:  Syncope RBBB First degree heart block GI bleeding Seen by EP, no indication for PPM No further syncope   Hypertension amlodipine           Medication Adjustments/Labs and Tests Ordered: Current medicines are reviewed at length with the patient today.  Concerns regarding medicines are outlined above.  No orders of the defined types were placed in this encounter.  No orders of the defined types were placed in this encounter.   Patient Instructions  Medication Instructions:  Your physician recommends that you continue on your  current medications as directed. Please refer to the Current Medication list given to you today.  *If you need a refill on your cardiac medications before your next appointment, please call your pharmacy*  Lab Work: NONE ordered at this time of appointment   If you have labs (blood work) drawn today and your tests are completely normal, you will receive your results only by: [place] (if you have MyChart) OR A paper copy in the mail If you have any lab test that is abnormal or we need to change your treatment, we will call you to review the  results.  Testing/Procedures: NONE ordered at this time of appointment   Follow-Up: At [facility], you and your health needs are our priority.  As part of our continuing mission to provide you with exceptional heart care, we have created designated Provider Care Teams.  These Care Teams include your primary Cardiologist (physician) and Advanced Practice Providers (APPs -  Physician Assistants and Nurse Practitioners) who all work together to provide you with the care you need, when you need it.  We recommend signing up for the patient portal called "MyChart".  Sign up information is provided on this After Visit Summary.  MyChart is used to connect with patients for Virtual Visits (Telemedicine).  Patients are able to view lab/test results, encounter notes, upcoming appointments, etc.  Non-urgent messages can be sent to your provider as well.   To learn more about what you can do with MyChart, go to [email or web address].    Your next appointment:   1 year(s)  The format for your next appointment:   In Person  Provider:   Minus Breeding, MD  or Fabian Sharp, PA-C        Other Instructions  Important Information About Sugar         Signed, Christine Salazar, [place]  02/04/2022 12:15 PM    [place]

## 2022-02-04 ENCOUNTER — Ambulatory Visit: Payer: Commercial Managed Care - HMO | Attending: Physician Assistant | Admitting: Physician Assistant

## 2022-02-04 ENCOUNTER — Encounter: Payer: Self-pay | Admitting: Physician Assistant

## 2022-02-04 VITALS — BP 136/86 | HR 87 | Ht 69.0 in | Wt 200.0 lb

## 2022-02-04 DIAGNOSIS — I10 Essential (primary) hypertension: Secondary | ICD-10-CM | POA: Diagnosis not present

## 2022-02-04 DIAGNOSIS — K648 Other hemorrhoids: Secondary | ICD-10-CM | POA: Diagnosis not present

## 2022-02-04 DIAGNOSIS — I440 Atrioventricular block, first degree: Secondary | ICD-10-CM

## 2022-02-04 DIAGNOSIS — I4510 Unspecified right bundle-branch block: Secondary | ICD-10-CM | POA: Diagnosis not present

## 2022-02-04 DIAGNOSIS — R55 Syncope and collapse: Secondary | ICD-10-CM

## 2022-02-04 NOTE — Patient Instructions (Signed)
Medication Instructions:  Your physician recommends that you continue on your current medications as directed. Please refer to the Current Medication list given to you today.  *If you need a refill on your cardiac medications before your next appointment, please call your pharmacy*  Lab Work: NONE ordered at this time of appointment   If you have labs (blood work) drawn today and your tests are completely normal, you will receive your results only by: [place] (if you have MyChart) OR A paper copy in the mail If you have any lab test that is abnormal or we need to change your treatment, we will call you to review the results.  Testing/Procedures: NONE ordered at this time of appointment   Follow-Up: At [facility], you and your health needs are our priority.  As part of our continuing mission to provide you with exceptional heart care, we have created designated Provider Care Teams.  These Care Teams include your primary Cardiologist (physician) and Advanced Practice Providers (APPs -  Physician Assistants and Nurse Practitioners) who all work together to provide you with the care you need, when you need it.  We recommend signing up for the patient portal called "MyChart".  Sign up information is provided on this After Visit Summary.  MyChart is used to connect with patients for Virtual Visits (Telemedicine).  Patients are able to view lab/test results, encounter notes, upcoming appointments, etc.  Non-urgent messages can be sent to your provider as well.   To learn more about what you can do with MyChart, go to [email or web address].    Your next appointment:   1 year(s)  The format for your next appointment:   In Person  Provider:   Minus Breeding, MD  or Fabian Sharp, PA-C        Other Instructions  Important Information About Sugar

## 2022-02-09 ENCOUNTER — Telehealth: Payer: Self-pay | Admitting: Cardiology

## 2022-02-09 DIAGNOSIS — H5213 Myopia, bilateral: Secondary | ICD-10-CM | POA: Diagnosis not present

## 2022-02-09 NOTE — Telephone Encounter (Signed)
Pt is requesting call back to discuss her last appt where she was told to see a G.I.  She said she was confused as to what this means and would like to discuss this. Please advise.

## 2022-02-09 NOTE — Telephone Encounter (Signed)
Called patient, made aware that GI would review her cause of GI bleeding.   Patient verbalized understanding, she will call them today.

## 2022-02-22 DIAGNOSIS — E669 Obesity, unspecified: Secondary | ICD-10-CM | POA: Diagnosis not present

## 2022-02-22 DIAGNOSIS — K5904 Chronic idiopathic constipation: Secondary | ICD-10-CM | POA: Diagnosis not present

## 2022-02-22 DIAGNOSIS — Z1211 Encounter for screening for malignant neoplasm of colon: Secondary | ICD-10-CM | POA: Diagnosis not present

## 2022-02-22 DIAGNOSIS — I10 Essential (primary) hypertension: Secondary | ICD-10-CM | POA: Diagnosis not present

## 2022-02-22 DIAGNOSIS — K921 Melena: Secondary | ICD-10-CM | POA: Diagnosis not present

## 2022-02-22 DIAGNOSIS — R634 Abnormal weight loss: Secondary | ICD-10-CM | POA: Diagnosis not present

## 2022-02-22 DIAGNOSIS — K5730 Diverticulosis of large intestine without perforation or abscess without bleeding: Secondary | ICD-10-CM | POA: Diagnosis not present

## 2023-02-04 NOTE — Progress Notes (Unsigned)
  Cardiology Office Note:   Date:  02/07/2023  ID:  Christine Salazar, DOB Sep 22, 1960, MRN [id number] PCP: Renaye Rakers, MD  [facility] Providers Cardiologist:  Rollene Rotunda, MD Cardiology APP:  Marcelino Duster, [place] {  History of Present Illness:   Christine Salazar is a 62 y.o. female  has no significant prior cardiac history.   We saw her last year because of syncope.  This was I the context of GI bleeding.  She had a monitor without any high degree heart block.   Since I last saw her last year she has had no further syncope.  She does a lot of hard yard work.  She pushes a lawnmower.  She is rate the leads.  She lives with her brother. The patient denies any new symptoms such as chest discomfort, neck or arm discomfort. There has been no new shortness of breath, PND or orthopnea. There have been no reported palpitations, presyncope or syncope.   Of note she has had none of the GI bleeding that she apparently had around the time of this event.  ROS: As stated in the HPI and negative for all other systems.  Studies Reviewed:    EKG:   EKG Interpretation Date/Time:  Tuesday February 07 2023 13:44:37 EST Ventricular Rate:  95 PR Interval:  260 QRS Duration:  150 QT Interval:  450 QTC Calculation: 565 R Axis:   73  Text Interpretation: Sinus rhythm with 1st degree A-V block with occasional Premature ventricular complexes Right bundle branch block When compared with ECG of 18-Dec-2021 14:06, Premature ventricular complexes are now Present Confirmed by Rollene Rotunda ([id number]) on 02/07/2023 2:05:45 PM     Risk Assessment/Calculations:              Physical Exam:   VS:  BP 120/80 (BP Location: Left Arm, Patient Position: Sitting, Cuff Size: Large)   Pulse 95   Ht 5\' 9"  (1.753 m)   Wt 210 lb (95.3 kg)   BMI 31.01 kg/m    Wt Readings from Last 3 Encounters:  02/07/23 210 lb (95.3 kg)  02/04/22 200 lb (90.7 kg)  12/20/21 202 lb 2.6 oz (91.7 kg)     GEN: Well  nourished, well developed in no acute distress NECK: No JVD; No carotid bruits CARDIAC: RRR, no murmurs, rubs, gallops RESPIRATORY:  Clear to auscultation without rales, wheezing or rhonchi  ABDOMEN: Soft, non-tender, non-distended EXTREMITIES:  No edema; No deformity   ASSESSMENT AND PLAN:   SYNCOPE: She has no further syncope.  No further workup is planned.  He will continue with risk reduction.  GI bleed: Her hemoglobin most recently was 13.7 in May.  No change in therapy.   RBBB: This has been chronic.  EKG is unchanged.  No further workup.     Follow up with me as needed  Signed, Rollene Rotunda, MD

## 2023-02-07 ENCOUNTER — Ambulatory Visit: Payer: MEDICAID | Attending: Cardiology | Admitting: Cardiology

## 2023-02-07 ENCOUNTER — Encounter: Payer: Self-pay | Admitting: Cardiology

## 2023-02-07 VITALS — BP 120/80 | HR 95 | Ht 69.0 in | Wt 210.0 lb

## 2023-02-07 DIAGNOSIS — R55 Syncope and collapse: Secondary | ICD-10-CM | POA: Diagnosis not present

## 2023-02-07 DIAGNOSIS — I10 Essential (primary) hypertension: Secondary | ICD-10-CM

## 2023-02-07 DIAGNOSIS — I4510 Unspecified right bundle-branch block: Secondary | ICD-10-CM

## 2023-02-07 NOTE — Patient Instructions (Signed)
 Medication Instructions:  No changes. *If you need a refill on your cardiac medications before your next appointment, please call your pharmacy*   Follow-Up: At [facility], you and your health needs are our priority.  As part of our continuing mission to provide you with exceptional heart care, we have created designated Provider Care Teams.  These Care Teams include your primary Cardiologist (physician) and Advanced Practice Providers (APPs -  Physician Assistants and Nurse Practitioners) who all work together to provide you with the care you need, when you need it.  We recommend signing up for the patient portal called "MyChart".  Sign up information is provided on this After Visit Summary.  MyChart is used to connect with patients for Virtual Visits (Telemedicine).  Patients are able to view lab/test results, encounter notes, upcoming appointments, etc.  Non-urgent messages can be sent to your provider as well.   To learn more about what you can do with MyChart, go to [email or web address].    Your next appointment:    As needed.   Provider:   Rollene Rotunda, MD

## 2023-05-14 ENCOUNTER — Encounter (HOSPITAL_COMMUNITY): Payer: Self-pay | Admitting: *Deleted

## 2023-05-14 ENCOUNTER — Other Ambulatory Visit: Payer: Self-pay

## 2023-05-14 ENCOUNTER — Emergency Department (HOSPITAL_COMMUNITY)
Admission: EM | Admit: 2023-05-14 | Discharge: 2023-05-14 | Disposition: A | Discharge status: Home / Self Care | Payer: MEDICAID | Attending: Student | Admitting: Student

## 2023-05-14 DIAGNOSIS — Z79899 Other long term (current) drug therapy: Secondary | ICD-10-CM | POA: Diagnosis not present

## 2023-05-14 DIAGNOSIS — I10 Essential (primary) hypertension: Secondary | ICD-10-CM | POA: Insufficient documentation

## 2023-05-14 DIAGNOSIS — I8390 Asymptomatic varicose veins of unspecified lower extremity: Secondary | ICD-10-CM

## 2023-05-14 DIAGNOSIS — M79605 Pain in left leg: Secondary | ICD-10-CM

## 2023-05-14 DIAGNOSIS — M79662 Pain in left lower leg: Secondary | ICD-10-CM | POA: Diagnosis present

## 2023-05-14 DIAGNOSIS — I83812 Varicose veins of left lower extremities with pain: Secondary | ICD-10-CM | POA: Diagnosis not present

## 2023-05-14 MED ORDER — ENOXAPARIN SODIUM 100 MG/ML IJ SOSY
1.0000 mg/kg | PREFILLED_SYRINGE | Freq: Once | INTRAMUSCULAR | Status: AC
  Administered 2023-05-14: 95 mg via SUBCUTANEOUS
  Filled 2023-05-14 (×2): qty 0.95

## 2023-05-14 NOTE — Discharge Instructions (Addendum)
 Please use Tylenol or ibuprofen for pain.  You may use 600 mg ibuprofen every 6 hours or 1000 mg of Tylenol every 6 hours.  You may choose to alternate between the 2.  This would be most effective.  Not to exceed 4 g of Tylenol within 24 hours.  Not to exceed 3200 mg ibuprofen 24 hours.  Please follow-up as instructed for an ultrasound tomorrow, it should give you some more specific instructions on your paperwork but normally we ask you to return at 11 AM for your vascular ultrasound study.  Again there should be more specific instructions on a separate attachment but I copied the most relevant portion of the instructions below:  "If tomorrow is a weekday (Monday-Friday), please go to [facility] Entrance C, [facility] Clinic Registration at 11 am and tell them you are there for a vascular study."

## 2023-05-14 NOTE — ED Notes (Signed)
 Message sent to main pharmacy stating to send patient's medication to the tube station in the orange zone tube.

## 2023-05-14 NOTE — ED Provider Notes (Signed)
 [place] EMERGENCY DEPARTMENT AT [facility] Provider Note   CSN: [id number] Arrival date & time: 05/14/23  1842     History  Chief Complaint  Patient presents with   Leg Pain    Christine Salazar is a 63 y.o. female with past medical history significant for hypertension, right bundle branch block, not taking a blood thinner who presents with concern for left calf, left posterior thigh pain after 1.5-hour car ride today.  She called her primary care doctor office and they told her to come to the emergency department to rule out a blood clot.  No previous history of similar.  Car ride was only 90 minutes, denies any chest pain, denies any shortness of breath.  History of varicose veins on the affected side.  Rates pain 4/10.   Leg Pain      Home Medications Prior to Admission medications   Medication Sig Start Date End Date Taking? Authorizing Provider  amLODipine (NORVASC) 5 MG tablet Take 5 mg by mouth daily.  05/31/13   [provider]      Allergies    Vancomycin    Review of Systems   Review of Systems  All other systems reviewed and are negative.   Physical Exam Updated Vital Signs BP (!) 152/91 (BP Location: Right Arm)   Pulse 96   Temp 98.4 F (36.9 C)   Resp 20   Ht 5\' 9"  (1.753 m)   Wt 95.3 kg   SpO2 100%   BMI 31.03 kg/m  Physical Exam Vitals and nursing note reviewed.  Constitutional:      General: She is not in acute distress.    Appearance: Normal appearance.  HENT:     Head: Normocephalic and atraumatic.  Eyes:     General:        Right eye: No discharge.        Left eye: No discharge.  Cardiovascular:     Rate and Rhythm: Normal rate and regular rhythm.     Pulses: Normal pulses.     Comments: DP, PT pulses are 2+ in the affected left lower extremity. Pulmonary:     Effort: Pulmonary effort is normal. No respiratory distress.  Musculoskeletal:        General: No deformity.     Comments: On my exam she has no  calf swelling, negative Homans' sign, she has multiple palpable varicose veins, she is most tender to palpation at the visualized varicose veins.  Skin:    General: Skin is warm and dry.     Capillary Refill: Capillary refill takes less than 2 seconds.  Neurological:     Mental Status: She is alert and oriented to person, place, and time.  Psychiatric:        Mood and Affect: Mood normal.        Behavior: Behavior normal.     ED Results / Procedures / Treatments   Labs (all labs ordered are listed, but only abnormal results are displayed) Labs Reviewed - No data to display  EKG None  Radiology No results found.  Procedures Procedures    Medications Ordered in ED Medications  enoxaparin (LOVENOX) injection 95 mg (has no administration in time range)    ED Course/ Medical Decision Making/ A&P                                 Medical Decision Making  This  is an overall well-appearing 63 year old female who presents with concern for acute left calf pain after recent 90-minute travel.  No previous history of blood clots, no respiratory symptoms, stable vital signs in the ED other than mild hypertension, blood pressure 152/91.  My emergent differential diagnosis includes DVT, peripheral edema, Achilles tendon rupture, calf muscle strain, calf muscle rupture, cellulitis, abscess, PAD, PVD, versus other musculoskeletal injury, although high clinical suspicion for pain related to varicose veins, given possibility of DVT although less likely will give single dose of Lovenox and schedule patient for outpatient ultrasound.  Patient and son understand and agreed to this plan.  Patient stable for discharge at this time.  Extensive return precautions given. Final Clinical Impression(s) / ED Diagnoses Final diagnoses:  Left leg pain  Varicose veins of calf    Rx / DC Orders ED Discharge Orders          Ordered    LE Venous       Comments: IMPORTANT PATIENT INSTRUCTIONS:  You have  been scheduled for an Outpatient Vascular Study at [facility].    If tomorrow is a Saturday, Sunday or holiday, please go to the [facility] Emergency Department Registration Desk at 11 am tomorrow morning and tell them you are there for a vascular study.   If tomorrow is a weekday (Monday-Friday), please go to [facility] Entrance C, Heart [facility] Vascular Center Clinic Registration at 11 am and tell them you are there for a vascular study.   05/14/23 2057              Olene Floss, PA-C 05/14/23 2105    Glendora Score, MD 05/15/23 1721

## 2023-05-14 NOTE — ED Triage Notes (Signed)
 The pt reports that her lt thigh  to her knee has been painful today  she rode a car trip 1 1/2 hours from [place] today  no known injury

## 2023-05-15 ENCOUNTER — Ambulatory Visit (HOSPITAL_COMMUNITY)
Admission: RE | Admit: 2023-05-15 | Discharge: 2023-05-15 | Disposition: A | Discharge status: Home / Self Care | Payer: MEDICAID | Source: Ambulatory Visit | Attending: Emergency Medicine | Admitting: Emergency Medicine

## 2023-05-15 DIAGNOSIS — M79605 Pain in left leg: Secondary | ICD-10-CM | POA: Diagnosis present

## 2023-05-15 DIAGNOSIS — M7989 Other specified soft tissue disorders: Secondary | ICD-10-CM | POA: Diagnosis not present

## 2023-06-01 ENCOUNTER — Other Ambulatory Visit: Payer: Self-pay | Admitting: *Deleted

## 2023-06-01 DIAGNOSIS — I8393 Asymptomatic varicose veins of bilateral lower extremities: Secondary | ICD-10-CM

## 2023-06-06 ENCOUNTER — Ambulatory Visit (INDEPENDENT_AMBULATORY_CARE_PROVIDER_SITE_OTHER): Payer: MEDICAID | Admitting: Physician Assistant

## 2023-06-06 ENCOUNTER — Ambulatory Visit (HOSPITAL_COMMUNITY)
Admission: RE | Admit: 2023-06-06 | Discharge: 2023-06-06 | Disposition: A | Discharge status: Home / Self Care | Payer: MEDICAID | Source: Ambulatory Visit | Attending: Vascular Surgery | Admitting: Vascular Surgery

## 2023-06-06 VITALS — BP 142/91 | HR 78 | Temp 98.3°F | Resp 18 | Ht 69.0 in | Wt 221.0 lb

## 2023-06-06 DIAGNOSIS — I8393 Asymptomatic varicose veins of bilateral lower extremities: Secondary | ICD-10-CM | POA: Insufficient documentation

## 2023-06-06 DIAGNOSIS — I82890 Acute embolism and thrombosis of other specified veins: Secondary | ICD-10-CM

## 2023-06-06 DIAGNOSIS — I83811 Varicose veins of right lower extremities with pain: Secondary | ICD-10-CM | POA: Diagnosis not present

## 2023-06-06 DIAGNOSIS — I872 Venous insufficiency (chronic) (peripheral): Secondary | ICD-10-CM | POA: Diagnosis not present

## 2023-06-06 NOTE — Progress Notes (Signed)
 Requested by:  Jonathon Neighbors, MD [address] [place],  [place] [address]  Reason for consultation: superficial vein thrombosis    History of Present Illness   Christine Salazar is a 63 y.o. (09-Aug-1960) female who presents for evaluation of superficial vein thrombosis.  The patient went to the emergency room on March 23 for left posterior thigh and upper calf pain after a 90-minute car ride.  She was given a single dose of Lovenox  and DVT study was obtained.  Her ultrasound demonstrated no DVT.  There was evidence of an age-indeterminate superficial vein thrombosis within her distal posterior thigh varicosities.   At today's visit, she is doing much better.  She no longer has any constant pain in her left thigh varicose vein.  She does have some stinging pain in it at night sometimes, especially if she was on her feet a lot that day.  She notes that this varicose vein has been present for several years without any issues.  She also notes numerous spider and reticular veins on her legs that have also been present for several years.  These do not cause her any pain or itching.  She denies any issues with lower extremity swelling.  She has worn knee-high compression stockings daily for years now.  She also elevates her legs almost every night.  She has no prior history of DVT or previous vein procedures.   Past Medical History:  Diagnosis Date   1st degree AV block    Hearing loss    Hypertension    Morton's neuroma of left foot    Right bundle branch block    Syncope     Past Surgical History:  Procedure Laterality Date   NEURECTOMY FOOT      Social History   Socioeconomic History   Marital status: Married    Spouse name: Not on file   Number of children: Not on file   Years of education: Not on file   Highest education level: Not on file  Occupational History   Not on file  Tobacco Use   Smoking status: Never   Smokeless tobacco: Never  Substance and Sexual Activity    Alcohol use: Not on file   Drug use: Not on file   Sexual activity: Not on file  Other Topics Concern   Not on file  Social History Narrative   Lives with brother and mom who died with hospice two years ago.     Social Drivers of Corporate investment banker Strain: Not on file  Food Insecurity: Not on file  Transportation Needs: Not on file  Physical Activity: Not on file  Stress: Not on file  Social Connections: Not on file  Intimate Partner Violence: Not on file    Family History  Problem Relation Age of Onset   Stroke Mother    Aortic dissection Mother    Hearing loss Neg Hx     Current Outpatient Medications  Medication Sig Dispense Refill   amLODipine  (NORVASC ) 5 MG tablet Take 5 mg by mouth daily.      naproxen (EC NAPROSYN) 500 MG EC tablet Take 500 mg by mouth 2 (two) times daily as needed (pain).     No current facility-administered medications for this visit.    Allergies  Allergen Reactions   Vancomycin  Hives    Arms and legs, seems to be true allergy and not just red-man syndrome.    REVIEW OF SYSTEMS (negative unless checked):  Cardiac:  []  Chest pain or chest pressure? []  Shortness of breath upon activity? []  Shortness of breath when lying flat? []  Irregular heart rhythm?  Vascular:  []  Pain in calf, thigh, or hip brought on by walking? []  Pain in feet at night that wakes you up from your sleep? []  Blood clot in your veins? []  Leg swelling?  Pulmonary:  []  Oxygen at home? []  Productive cough? []  Wheezing?  Neurologic:  []  Sudden weakness in arms or legs? []  Sudden numbness in arms or legs? []  Sudden onset of difficult speaking or slurred speech? []  Temporary loss of vision in one eye? []  Problems with dizziness?  Gastrointestinal:  []  Blood in stool? []  Vomited blood?  Genitourinary:  []  Burning when urinating? []  Blood in urine?  Psychiatric:  []  Major depression  Hematologic:  []  Bleeding problems? []  Problems with  blood clotting?  Dermatologic:  []  Rashes or ulcers?  Constitutional:  []  Fever or chills?  Ear/Nose/Throat:  []  Change in hearing? []  Nose bleeds? []  Sore throat?  Musculoskeletal:  []  Back pain? []  Joint pain? []  Muscle pain?   Physical Examination     Vitals:   06/06/23 1219  BP: (!) 142/91  Pulse: 78  Resp: 18  Temp: 98.3 F (36.8 C)  TempSrc: Temporal  SpO2: 97%  Weight: 221 lb (100.2 kg)  Height: 5\' 9"  (1.753 m)   Body mass index is 32.64 kg/m.  General:  WDWN in NAD; vital signs documented above Gait: Not observed HENT: WNL, normocephalic Pulmonary: normal non-labored breathing  Cardiac: regular Abdomen: soft, NT, no masses Skin: without rashes Vascular Exam/Pulses: BLE warm and well perfused Extremities: Numerous reticular and spider veins on BLE.  Left medial/posterior distal thigh varicose vein without warmth or erythema. Varicose vein is somewhat hard to touch but nontender. No edema, stasis pigmentation, or ulcerations  Musculoskeletal: no muscle wasting or atrophy  Neurologic: A&O X 3;  No focal weakness or paresthesias are detected Psychiatric:  The pt has Normal affect.  Non-invasive Vascular Imaging   LLE Venous Insufficiency Duplex (06/06/2023):  +--------------+---------+------+-----------+------------+--------+  LEFT         Reflux NoRefluxReflux TimeDiameter cmsComments                          Yes                                   +--------------+---------+------+-----------+------------+--------+  CFV                    yes   >1 second                       +--------------+---------+------+-----------+------------+--------+  FV mid                  yes   >1 second                       +--------------+---------+------+-----------+------------+--------+  Popliteal    no                                              +--------------+---------+------+-----------+------------+--------+  GSV at [facility]    no  0.75              +--------------+---------+------+-----------+------------+--------+  GSV prox thigh          yes    >500 ms      0.52              +--------------+---------+------+-----------+------------+--------+  GSV mid thigh no                            0.19              +--------------+---------+------+-----------+------------+--------+  GSV dist thighno                            0.34              +--------------+---------+------+-----------+------------+--------+  GSV at knee   no                            0.33              +--------------+---------+------+-----------+------------+--------+  GSV prox calf           yes    >500 ms      0.38              +--------------+---------+------+-----------+------------+--------+  GSV mid calf            yes    >500 ms      0.17              +--------------+---------+------+-----------+------------+--------+  GSV dist calf           yes    >500 ms      0.41              +--------------+---------+------+-----------+------------+--------+  SSV Pop Fossa           yes    >500 ms      0.41              +--------------+---------+------+-----------+------------+--------+  SSV prox calf           yes    >500 ms      0.38    branches  +--------------+---------+------+-----------+------------+--------+  SSV mid calf            yes    >500 ms      0.35              +--------------+---------+------+-----------+------------+--------+   Non compressible varicosity in the distal posterior thigh, probable  chronic   Medical Decision Making   Christine Salazar is a 63 y.o. female who presents for evaluation of superficial vein thrombosis  Based on the patient's duplex, there is reflux in the left common femoral vein, femoral vein, and greater saphenous vein at the proximal thigh, proximal calf, mid calf, and distal calf.  There is also reflux  throughout the entire SSV.  There is no evidence of DVT on exam.  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  She would not be a candidate for GSV ablation because it is competent at the [facility] and throughout most of the thigh. The patient experienced sudden onset pain in one of her distal left posterior thigh varicosities on March 23.  During her ED evaluation, duplex demonstrated no DVT.  There was evidence of superficial thrombosis of this varicosity. She states her pain in this  varicose vein has significantly improved since March 23.  She now intermittently gets stabbing pain in this vein at night, especially if she was on her legs a lot that day.  She denies any symptoms with any of her other varicose or reticular veins.  She also denies any leg swelling. On exam she does have a left posterior, distal thigh varicosity that is somewhat firm to touch.  This is not tender to touch.  I have explained to the patient that her pain is likely due to thrombosis of this varicose vein.  I have reassured the patient that this is not life or limb threatening and should improve with conservative therapy.  She does not need anticoagulation given that her thrombosis is only in a small segment of superficial veins. I have recommended that she continue conservative therapy including compression and elevation. I offered to provide thigh-high compression stockings versus local ace wrap to the area for compression.  She prefers to use the ace wrap as needed to help decompress the varicose vein and help with pain She can follow-up with our office as needed   Ron Cobbs, PA-C [facility] Office: [phone]  06/06/2023, 12:31 PM  Clinic MD: [place] [facility]

## 2023-10-16 ENCOUNTER — Telehealth: Payer: Self-pay | Admitting: Cardiology

## 2023-10-16 NOTE — Telephone Encounter (Signed)
 Pt is returning call.

## 2023-10-16 NOTE — Telephone Encounter (Signed)
 Patient states Dr. Vickii office will be faxing heart monitor results to our office and she expects to hear from us  after Dr. Lavona reviews.

## 2023-10-16 NOTE — Telephone Encounter (Signed)
 Pt is calling to receive results from her heart monitor that she wore 7/14-7/28.

## 2023-10-16 NOTE — Telephone Encounter (Signed)
 Left message for patient to callback.  No monitor ordered in July 2024. Last heart monitor ordered December 2023. Saw Dr. Lavona December 2024 with as needed follow-up.

## 2023-10-18 NOTE — Telephone Encounter (Signed)
  Patient calling to following up if Dr. Edison received her heart monitor result

## 2023-10-18 NOTE — Telephone Encounter (Signed)
 Tried calling the pt back and she did not answer and voicemail was not functioning at that time.

## 2023-10-19 NOTE — Telephone Encounter (Signed)
 Left message for pt to call.

## 2023-10-19 NOTE — Telephone Encounter (Signed)
 Pt returning nurse call

## 2023-10-19 NOTE — Telephone Encounter (Signed)
 Left voice message to call back 8/28

## 2023-10-19 NOTE — Telephone Encounter (Signed)
 Patient is returning phone call.

## 2023-10-20 NOTE — Telephone Encounter (Signed)
 Patient returned RN's call regarding results.

## 2023-10-20 NOTE — Telephone Encounter (Signed)
 Spoke with pt and let her know that I cannot find her heart monitor results and to have them resent. Pt verbalize understanding. All questions if any were answered.

## 2023-10-20 NOTE — Telephone Encounter (Signed)
 Pt returning nurses call.

## 2023-10-24 ENCOUNTER — Telehealth: Payer: Self-pay

## 2023-10-24 NOTE — Telephone Encounter (Signed)
-----   Message from Lynwood Schilling sent at 10/23/2023  8:37 PM EDT ----- I was not the ordering physician so she should have received results from the [facility] as the ordering MDs.  I don't see any sustained arrhythmias on this monitor.  There were PVCs.  I am not sure what they were looking for.  I would be happy to see her back if she is having problems. ----- Message ----- From: Smitty Jonel CROME Sent: 10/20/2023   4:31 PM EDT To: Lynwood Schilling, MD; Aleck LOISE Bill, RN

## 2023-10-24 NOTE — Telephone Encounter (Signed)
 Spoke with pt and relayed Dr. Denver comments. Pt verbalized understanding. All questions if any were answered.

## 2024-01-01 NOTE — Progress Notes (Unsigned)
  Cardiology Office Note:   Date:  01/03/2024  ID:  Christine Salazar, DOB 1960-09-10, MRN [id number] PCP: Benjamine Aland, MD  [facility] Providers Cardiologist:  Lynwood Schilling, MD Cardiology APP:  Madie Jon Garre, [place] {  History of Present Illness:   Christine Salazar is a 63 y.o. female  has no significant prior cardiac history.   We saw her previously because of syncope.  This was I the context of GI bleeding.  She had a monitor without any high degree heart block.   Since I last saw her she has had no new problems.  She has had no further syncope.  She does activity such as uses a pedal machine in her house, pushes a mower and rakes or leaves. The patient denies any new symptoms such as chest discomfort, neck or arm discomfort. There has been no new shortness of breath, PND or orthopnea. There have been no reported palpitations, presyncope or syncope.   ROS: As stated in the HPI and negative for all other systems.  Studies Reviewed:    EKG:   EKG Interpretation Date/Time:  Wednesday January 03 2024 10:49:54 EST Ventricular Rate:  87 PR Interval:  294 QRS Duration:  146 QT Interval:  386 QTC Calculation: 464 R Axis:   61  Text Interpretation: Sinus rhythm with 1st degree A-V block Right bundle branch block T wave abnormality, consider inferior ischemia No significant change since last tracing Confirmed by Schilling Lynwood ([id number]) on 01/03/2024 10:55:31 AM    Risk Assessment/Calculations:              Physical Exam:   VS:  BP 134/84   Pulse 87   Ht 5' 10 (1.778 m)   Wt 212 lb 6.4 oz (96.3 kg)   SpO2 98%   BMI 30.48 kg/m    Wt Readings from Last 3 Encounters:  01/03/24 212 lb 6.4 oz (96.3 kg)  06/06/23 221 lb (100.2 kg)  05/14/23 210 lb 1.6 oz (95.3 kg)     GEN: Well nourished, well developed in no acute distress NECK: No JVD; No carotid bruits CARDIAC: RRR, no murmurs, rubs, gallops RESPIRATORY:  Clear to auscultation without rales, wheezing or  rhonchi  ABDOMEN: Soft, non-tender, non-distended this is Christine Salazar can return as needed she is needing EXTREMITIES:  No edema; No deformity   ASSESSMENT AND PLAN:   SYNCOPE:   The patient is had no further syncope.  No further change in therapy.  RBBB: This has been chronic.  We reviewed this today.  No further workup.  HTN: Her blood pressure is well-controlled and she is on a good medical regimen.  No change in therapy.    Follow up with me as needed  Signed, Lynwood Schilling, MD

## 2024-01-03 ENCOUNTER — Encounter: Payer: Self-pay | Admitting: Cardiology

## 2024-01-03 ENCOUNTER — Ambulatory Visit: Payer: MEDICAID | Attending: Cardiology | Admitting: Cardiology

## 2024-01-03 VITALS — BP 134/84 | HR 87 | Ht 70.0 in | Wt 212.4 lb

## 2024-01-03 DIAGNOSIS — R55 Syncope and collapse: Secondary | ICD-10-CM | POA: Diagnosis not present

## 2024-01-03 NOTE — Patient Instructions (Signed)
 Medication Instructions:  Your physician recommends that you continue on your current medications as directed. Please refer to the Current Medication list given to you today.  *If you need a refill on your cardiac medications before your next appointment, please call your pharmacy*  Lab Work: None ordered.  If you have labs (blood work) drawn today and your tests are completely normal, you will receive your results only by: MyChart Message (if you have MyChart) OR A paper copy in the mail If you have any lab test that is abnormal or we need to change your treatment, we will call you to review the results.  Testing/Procedures: None ordered.   Follow-Up: At [facility], you and your health needs are our priority.  As part of our continuing mission to provide you with exceptional heart care, our providers are all part of one team.  This team includes your primary Cardiologist (physician) and Advanced Practice Providers or APPs (Physician Assistants and Nurse Practitioners) who all work together to provide you with the care you need, when you need it.  Your next appointment:   Follow up with Dr Lavona as needed
